# Patient Record
Sex: Female | Born: 1940
Health system: Southern US, Community
[De-identification: ages and names within clinical notes are randomized; demographics above are authoritative.]

## PROBLEM LIST (undated history)

## (undated) DIAGNOSIS — Z860100 Personal history of colon polyps, unspecified: Secondary | ICD-10-CM

## (undated) DIAGNOSIS — I10 Essential (primary) hypertension: Secondary | ICD-10-CM

## (undated) DIAGNOSIS — M1990 Unspecified osteoarthritis, unspecified site: Secondary | ICD-10-CM

## (undated) DIAGNOSIS — E785 Hyperlipidemia, unspecified: Secondary | ICD-10-CM

## (undated) DIAGNOSIS — D649 Anemia, unspecified: Secondary | ICD-10-CM

## (undated) DIAGNOSIS — Z8719 Personal history of other diseases of the digestive system: Secondary | ICD-10-CM

## (undated) DIAGNOSIS — Z923 Personal history of irradiation: Secondary | ICD-10-CM

## (undated) DIAGNOSIS — C50919 Malignant neoplasm of unspecified site of unspecified female breast: Secondary | ICD-10-CM

## (undated) DIAGNOSIS — T7840XA Allergy, unspecified, initial encounter: Secondary | ICD-10-CM

## (undated) HISTORY — DX: Malignant neoplasm of unspecified site of unspecified female breast: C50.919

## (undated) HISTORY — PX: TUBAL LIGATION: SHX77

## (undated) HISTORY — DX: Anemia, unspecified: D64.9

## (undated) HISTORY — DX: Unspecified osteoarthritis, unspecified site: M19.90

## (undated) HISTORY — DX: Essential (primary) hypertension: I10

---

## 1999-09-23 ENCOUNTER — Other Ambulatory Visit: Admission: RE | Admit: 1999-09-23 | Discharge: 1999-09-23 | Payer: Self-pay | Admitting: *Deleted

## 2001-09-13 ENCOUNTER — Other Ambulatory Visit: Admission: RE | Admit: 2001-09-13 | Discharge: 2001-09-13 | Payer: Self-pay | Admitting: *Deleted

## 2004-10-14 ENCOUNTER — Ambulatory Visit: Payer: Self-pay | Admitting: Internal Medicine

## 2004-10-28 ENCOUNTER — Ambulatory Visit: Payer: Self-pay | Admitting: Internal Medicine

## 2005-11-17 ENCOUNTER — Ambulatory Visit: Payer: Self-pay | Admitting: Internal Medicine

## 2006-11-30 ENCOUNTER — Ambulatory Visit: Payer: Self-pay | Admitting: Internal Medicine

## 2006-12-06 ENCOUNTER — Ambulatory Visit: Payer: Self-pay | Admitting: Internal Medicine

## 2007-01-03 ENCOUNTER — Ambulatory Visit: Payer: Self-pay | Admitting: Unknown Physician Specialty

## 2007-12-04 ENCOUNTER — Ambulatory Visit: Payer: Self-pay | Admitting: Internal Medicine

## 2007-12-31 ENCOUNTER — Ambulatory Visit: Payer: Self-pay | Admitting: Internal Medicine

## 2008-11-07 DIAGNOSIS — Z923 Personal history of irradiation: Secondary | ICD-10-CM

## 2008-11-07 DIAGNOSIS — C50919 Malignant neoplasm of unspecified site of unspecified female breast: Secondary | ICD-10-CM

## 2008-11-07 HISTORY — DX: Personal history of irradiation: Z92.3

## 2008-11-07 HISTORY — PX: BREAST LUMPECTOMY: SHX2

## 2008-11-07 HISTORY — PX: BREAST BIOPSY: SHX20

## 2008-11-07 HISTORY — DX: Malignant neoplasm of unspecified site of unspecified female breast: C50.919

## 2009-01-06 ENCOUNTER — Ambulatory Visit: Payer: Self-pay | Admitting: Internal Medicine

## 2009-01-08 ENCOUNTER — Ambulatory Visit: Payer: Self-pay | Admitting: Internal Medicine

## 2009-02-16 ENCOUNTER — Ambulatory Visit: Payer: Self-pay | Admitting: Surgery

## 2009-03-09 ENCOUNTER — Ambulatory Visit: Payer: Self-pay | Admitting: Surgery

## 2009-03-26 ENCOUNTER — Ambulatory Visit: Payer: Self-pay | Admitting: Surgery

## 2009-04-07 ENCOUNTER — Ambulatory Visit: Payer: Self-pay | Admitting: Oncology

## 2009-04-10 ENCOUNTER — Ambulatory Visit: Payer: Self-pay | Admitting: Oncology

## 2009-04-23 ENCOUNTER — Ambulatory Visit: Payer: Self-pay | Admitting: Surgery

## 2009-05-07 ENCOUNTER — Ambulatory Visit: Payer: Self-pay | Admitting: Oncology

## 2009-06-07 ENCOUNTER — Ambulatory Visit: Payer: Self-pay | Admitting: Oncology

## 2009-10-07 ENCOUNTER — Ambulatory Visit: Payer: Self-pay | Admitting: Oncology

## 2009-10-15 ENCOUNTER — Ambulatory Visit: Payer: Self-pay | Admitting: Oncology

## 2009-11-07 ENCOUNTER — Ambulatory Visit: Payer: Self-pay | Admitting: Oncology

## 2010-01-07 ENCOUNTER — Ambulatory Visit: Payer: Self-pay | Admitting: Oncology

## 2010-03-07 ENCOUNTER — Ambulatory Visit: Payer: Self-pay | Admitting: Oncology

## 2010-03-08 ENCOUNTER — Ambulatory Visit: Payer: Self-pay | Admitting: Oncology

## 2010-04-07 ENCOUNTER — Ambulatory Visit: Payer: Self-pay | Admitting: Oncology

## 2010-04-15 ENCOUNTER — Ambulatory Visit: Payer: Self-pay | Admitting: Oncology

## 2010-05-07 ENCOUNTER — Ambulatory Visit: Payer: Self-pay | Admitting: Oncology

## 2010-08-13 ENCOUNTER — Ambulatory Visit: Payer: Self-pay | Admitting: Oncology

## 2010-08-16 ENCOUNTER — Ambulatory Visit: Payer: Self-pay | Admitting: Oncology

## 2010-08-17 LAB — CANCER ANTIGEN 27.29: CA 27.29: 33.2 U/mL (ref 0.0–38.6)

## 2010-09-07 ENCOUNTER — Ambulatory Visit: Payer: Self-pay | Admitting: Oncology

## 2011-01-06 ENCOUNTER — Ambulatory Visit: Payer: Self-pay

## 2011-02-07 ENCOUNTER — Ambulatory Visit: Payer: Self-pay | Admitting: Oncology

## 2011-02-08 ENCOUNTER — Ambulatory Visit: Payer: Self-pay | Admitting: Oncology

## 2011-03-03 ENCOUNTER — Ambulatory Visit: Payer: Self-pay | Admitting: Oncology

## 2011-03-05 LAB — CANCER ANTIGEN 27.29: CA 27.29: 33 U/mL (ref 0.0–38.6)

## 2011-03-08 ENCOUNTER — Ambulatory Visit: Payer: Self-pay | Admitting: Oncology

## 2011-04-08 ENCOUNTER — Ambulatory Visit: Payer: Self-pay

## 2011-08-10 ENCOUNTER — Ambulatory Visit: Payer: Self-pay | Admitting: Nurse Practitioner

## 2011-08-22 ENCOUNTER — Ambulatory Visit: Payer: Self-pay | Admitting: Oncology

## 2011-08-23 LAB — CANCER ANTIGEN 27.29: CA 27.29: 31.7 U/mL (ref 0.0–38.6)

## 2011-09-08 ENCOUNTER — Ambulatory Visit: Payer: Self-pay | Admitting: Oncology

## 2012-04-24 ENCOUNTER — Ambulatory Visit: Payer: Self-pay | Admitting: Unknown Physician Specialty

## 2012-04-24 HISTORY — PX: COLONOSCOPY: SHX174

## 2012-05-28 ENCOUNTER — Ambulatory Visit: Payer: Self-pay | Admitting: Oncology

## 2012-06-05 LAB — COMPREHENSIVE METABOLIC PANEL
Albumin: 4.2 g/dL (ref 3.4–5.0)
Alkaline Phosphatase: 107 U/L (ref 50–136)
Anion Gap: 11 (ref 7–16)
BUN: 21 mg/dL — ABNORMAL HIGH (ref 7–18)
Bilirubin,Total: 0.6 mg/dL (ref 0.2–1.0)
Calcium, Total: 10.1 mg/dL (ref 8.5–10.1)
Chloride: 102 mmol/L (ref 98–107)
Co2: 25 mmol/L (ref 21–32)
Creatinine: 0.94 mg/dL (ref 0.60–1.30)
EGFR (African American): >60
EGFR (Non-African Amer.): >60
Glucose: 104 mg/dL — ABNORMAL HIGH (ref 65–99)
Osmolality: 279 (ref 275–301)
Potassium: 3.9 mmol/L (ref 3.5–5.1)
SGOT(AST): 22 U/L (ref 15–37)
SGPT (ALT): 34 U/L
Sodium: 138 mmol/L (ref 136–145)
Total Protein: 7.5 g/dL (ref 6.4–8.2)

## 2012-06-05 LAB — CBC CANCER CENTER
Basophil #: 0 x10 3/mm (ref 0.0–0.1)
Basophil %: 0.4 %
Eosinophil #: 0.2 x10 3/mm (ref 0.0–0.7)
Eosinophil %: 2.1 %
HCT: 36.9 % (ref 35.0–47.0)
HGB: 12.9 g/dL (ref 12.0–16.0)
Lymphocyte #: 2.2 x10 3/mm (ref 1.0–3.6)
Lymphocyte %: 29.5 %
MCH: 31.1 pg (ref 26.0–34.0)
MCHC: 35 g/dL (ref 32.0–36.0)
MCV: 89 fL (ref 80–100)
Monocyte #: 0.6 x10 3/mm (ref 0.2–0.9)
Monocyte %: 7.6 %
Neutrophil #: 4.4 x10 3/mm (ref 1.4–6.5)
Neutrophil %: 60.4 %
Platelet: 272 x10 3/mm (ref 150–440)
RBC: 4.15 10*6/uL (ref 3.80–5.20)
RDW: 13.6 % (ref 11.5–14.5)
WBC: 7.3 x10 3/mm (ref 3.6–11.0)

## 2012-06-06 LAB — CANCER ANTIGEN 27.29: CA 27.29: 37.6 U/mL (ref 0.0–38.6)

## 2012-06-07 ENCOUNTER — Ambulatory Visit: Payer: Self-pay | Admitting: Oncology

## 2012-07-08 ENCOUNTER — Ambulatory Visit: Payer: Self-pay | Admitting: Oncology

## 2012-11-07 ENCOUNTER — Ambulatory Visit: Payer: Self-pay | Admitting: Oncology

## 2012-12-04 LAB — CBC CANCER CENTER
Basophil #: 0 x10 3/mm (ref 0.0–0.1)
Basophil %: 0.6 %
Eosinophil #: 0.2 x10 3/mm (ref 0.0–0.7)
Eosinophil %: 3 %
HCT: 37.2 % (ref 35.0–47.0)
HGB: 12.6 g/dL (ref 12.0–16.0)
Lymphocyte #: 1.8 x10 3/mm (ref 1.0–3.6)
Lymphocyte %: 26.1 %
MCH: 30 pg (ref 26.0–34.0)
MCHC: 33.8 g/dL (ref 32.0–36.0)
MCV: 89 fL (ref 80–100)
Monocyte #: 0.5 x10 3/mm (ref 0.2–0.9)
Monocyte %: 7.8 %
Neutrophil #: 4.3 x10 3/mm (ref 1.4–6.5)
Neutrophil %: 62.5 %
Platelet: 252 x10 3/mm (ref 150–440)
RBC: 4.19 10*6/uL (ref 3.80–5.20)
RDW: 13.5 % (ref 11.5–14.5)
WBC: 6.9 x10 3/mm (ref 3.6–11.0)

## 2012-12-04 LAB — COMPREHENSIVE METABOLIC PANEL
Albumin: 4 g/dL (ref 3.4–5.0)
Alkaline Phosphatase: 102 U/L (ref 50–136)
Anion Gap: 9 (ref 7–16)
BUN: 18 mg/dL (ref 7–18)
Bilirubin,Total: 0.5 mg/dL (ref 0.2–1.0)
Calcium, Total: 9.9 mg/dL (ref 8.5–10.1)
Chloride: 104 mmol/L (ref 98–107)
Co2: 27 mmol/L (ref 21–32)
Creatinine: 0.86 mg/dL (ref 0.60–1.30)
EGFR (African American): >60
EGFR (Non-African Amer.): >60
Glucose: 123 mg/dL — ABNORMAL HIGH (ref 65–99)
Osmolality: 283 (ref 275–301)
Potassium: 4.1 mmol/L (ref 3.5–5.1)
SGOT(AST): 21 U/L (ref 15–37)
SGPT (ALT): 34 U/L (ref 12–78)
Sodium: 140 mmol/L (ref 136–145)
Total Protein: 7.3 g/dL (ref 6.4–8.2)

## 2012-12-05 LAB — CANCER ANTIGEN 27.29: CA 27.29: 35.8 U/mL (ref 0.0–38.6)

## 2012-12-08 ENCOUNTER — Ambulatory Visit: Payer: Self-pay | Admitting: Oncology

## 2013-05-07 ENCOUNTER — Ambulatory Visit: Payer: Self-pay | Admitting: Oncology

## 2013-06-04 LAB — CBC CANCER CENTER
Basophil #: 0 x10 3/mm (ref 0.0–0.1)
Basophil %: 0.7 %
Eosinophil #: 0.2 x10 3/mm (ref 0.0–0.7)
Eosinophil %: 3.4 %
HCT: 35.7 % (ref 35.0–47.0)
HGB: 12.6 g/dL (ref 12.0–16.0)
Lymphocyte #: 2.1 x10 3/mm (ref 1.0–3.6)
Lymphocyte %: 31.6 %
MCH: 31 pg (ref 26.0–34.0)
MCHC: 35.3 g/dL (ref 32.0–36.0)
MCV: 88 fL (ref 80–100)
Monocyte #: 0.5 x10 3/mm (ref 0.2–0.9)
Monocyte %: 6.9 %
Neutrophil #: 3.9 x10 3/mm (ref 1.4–6.5)
Neutrophil %: 57.4 %
Platelet: 263 x10 3/mm (ref 150–440)
RBC: 4.08 10*6/uL (ref 3.80–5.20)
RDW: 14 % (ref 11.5–14.5)
WBC: 6.7 x10 3/mm (ref 3.6–11.0)

## 2013-06-04 LAB — COMPREHENSIVE METABOLIC PANEL
Albumin: 3.8 g/dL (ref 3.4–5.0)
Alkaline Phosphatase: 119 U/L (ref 50–136)
Anion Gap: 8 (ref 7–16)
BUN: 17 mg/dL (ref 7–18)
Bilirubin,Total: 0.5 mg/dL (ref 0.2–1.0)
Calcium, Total: 9.6 mg/dL (ref 8.5–10.1)
Chloride: 105 mmol/L (ref 98–107)
Co2: 29 mmol/L (ref 21–32)
Creatinine: 0.9 mg/dL (ref 0.60–1.30)
EGFR (African American): >60
EGFR (Non-African Amer.): >60
Glucose: 109 mg/dL — ABNORMAL HIGH (ref 65–99)
Osmolality: 285 (ref 275–301)
Potassium: 3.9 mmol/L (ref 3.5–5.1)
SGOT(AST): 19 U/L (ref 15–37)
SGPT (ALT): 30 U/L (ref 12–78)
Sodium: 142 mmol/L (ref 136–145)
Total Protein: 7.1 g/dL (ref 6.4–8.2)

## 2013-06-05 LAB — CANCER ANTIGEN 27.29: CA 27.29: 34.5 U/mL (ref 0.0–38.6)

## 2013-06-07 ENCOUNTER — Ambulatory Visit: Payer: Self-pay | Admitting: Oncology

## 2013-07-08 ENCOUNTER — Ambulatory Visit: Payer: Self-pay | Admitting: Oncology

## 2013-12-05 ENCOUNTER — Ambulatory Visit: Payer: Self-pay | Admitting: Oncology

## 2013-12-05 LAB — CBC CANCER CENTER
Basophil #: 0 x10 3/mm (ref 0.0–0.1)
Basophil %: 0.7 %
Eosinophil #: 0.2 x10 3/mm (ref 0.0–0.7)
Eosinophil %: 2.6 %
HCT: 38.6 % (ref 35.0–47.0)
HGB: 12.8 g/dL (ref 12.0–16.0)
Lymphocyte #: 2.1 x10 3/mm (ref 1.0–3.6)
Lymphocyte %: 27.6 %
MCH: 29.8 pg (ref 26.0–34.0)
MCHC: 33.2 g/dL (ref 32.0–36.0)
MCV: 90 fL (ref 80–100)
Monocyte #: 0.6 x10 3/mm (ref 0.2–0.9)
Monocyte %: 8 %
Neutrophil #: 4.5 x10 3/mm (ref 1.4–6.5)
Neutrophil %: 61.1 %
Platelet: 286 x10 3/mm (ref 150–440)
RBC: 4.32 10*6/uL (ref 3.80–5.20)
RDW: 13.7 % (ref 11.5–14.5)
WBC: 7.4 x10 3/mm (ref 3.6–11.0)

## 2013-12-05 LAB — COMPREHENSIVE METABOLIC PANEL
Albumin: 3.9 g/dL (ref 3.4–5.0)
Alkaline Phosphatase: 106 U/L
Anion Gap: 9 (ref 7–16)
BUN: 17 mg/dL (ref 7–18)
Bilirubin,Total: 0.5 mg/dL (ref 0.2–1.0)
Calcium, Total: 9.9 mg/dL (ref 8.5–10.1)
Chloride: 104 mmol/L (ref 98–107)
Co2: 27 mmol/L (ref 21–32)
Creatinine: 0.84 mg/dL (ref 0.60–1.30)
EGFR (African American): >60
EGFR (Non-African Amer.): >60
Glucose: 104 mg/dL — ABNORMAL HIGH (ref 65–99)
Osmolality: 281 (ref 275–301)
Potassium: 4 mmol/L (ref 3.5–5.1)
SGOT(AST): 22 U/L (ref 15–37)
SGPT (ALT): 34 U/L (ref 12–78)
Sodium: 140 mmol/L (ref 136–145)
Total Protein: 7.3 g/dL (ref 6.4–8.2)

## 2013-12-06 LAB — CANCER ANTIGEN 27.29: CA 27.29: 35.7 U/mL (ref 0.0–38.6)

## 2013-12-08 ENCOUNTER — Ambulatory Visit: Payer: Self-pay | Admitting: Oncology

## 2014-06-26 ENCOUNTER — Ambulatory Visit: Payer: Self-pay | Admitting: Oncology

## 2014-06-26 LAB — COMPREHENSIVE METABOLIC PANEL
Albumin: 3.8 g/dL (ref 3.4–5.0)
Alkaline Phosphatase: 99 U/L
Anion Gap: 9 (ref 7–16)
BUN: 14 mg/dL (ref 7–18)
Bilirubin,Total: 0.6 mg/dL (ref 0.2–1.0)
Calcium, Total: 9.8 mg/dL (ref 8.5–10.1)
Chloride: 104 mmol/L (ref 98–107)
Co2: 28 mmol/L (ref 21–32)
Creatinine: 0.94 mg/dL (ref 0.60–1.30)
EGFR (African American): >60
EGFR (Non-African Amer.): >60
Glucose: 101 mg/dL — ABNORMAL HIGH (ref 65–99)
Osmolality: 282 (ref 275–301)
Potassium: 4.1 mmol/L (ref 3.5–5.1)
SGOT(AST): 22 U/L (ref 15–37)
SGPT (ALT): 32 U/L
Sodium: 141 mmol/L (ref 136–145)
Total Protein: 7.2 g/dL (ref 6.4–8.2)

## 2014-06-26 LAB — CBC CANCER CENTER
Basophil #: 0 x10 3/mm (ref 0.0–0.1)
Basophil %: 0.7 %
Eosinophil #: 0.2 x10 3/mm (ref 0.0–0.7)
Eosinophil %: 3 %
HCT: 38 % (ref 35.0–47.0)
HGB: 12.9 g/dL (ref 12.0–16.0)
Lymphocyte #: 2 x10 3/mm (ref 1.0–3.6)
Lymphocyte %: 30.7 %
MCH: 30.3 pg (ref 26.0–34.0)
MCHC: 33.9 g/dL (ref 32.0–36.0)
MCV: 89 fL (ref 80–100)
Monocyte #: 0.5 x10 3/mm (ref 0.2–0.9)
Monocyte %: 7.6 %
Neutrophil #: 3.7 x10 3/mm (ref 1.4–6.5)
Neutrophil %: 58 %
Platelet: 281 x10 3/mm (ref 150–440)
RBC: 4.24 10*6/uL (ref 3.80–5.20)
RDW: 13.4 % (ref 11.5–14.5)
WBC: 6.4 x10 3/mm (ref 3.6–11.0)

## 2014-06-27 ENCOUNTER — Ambulatory Visit: Payer: Self-pay | Admitting: Oncology

## 2014-06-27 LAB — CANCER ANTIGEN 27.29: CA 27.29: 41.2 U/mL — ABNORMAL HIGH (ref 0.0–38.6)

## 2014-07-08 ENCOUNTER — Ambulatory Visit: Payer: Self-pay | Admitting: Oncology

## 2015-07-02 ENCOUNTER — Ambulatory Visit: Payer: Self-pay | Admitting: Oncology

## 2015-07-02 ENCOUNTER — Other Ambulatory Visit: Payer: Self-pay

## 2015-07-21 ENCOUNTER — Ambulatory Visit: Payer: Self-pay | Admitting: Oncology

## 2015-07-21 ENCOUNTER — Other Ambulatory Visit: Payer: Self-pay

## 2015-07-24 ENCOUNTER — Other Ambulatory Visit: Payer: Self-pay | Admitting: *Deleted

## 2015-07-24 DIAGNOSIS — C50919 Malignant neoplasm of unspecified site of unspecified female breast: Secondary | ICD-10-CM

## 2015-07-28 ENCOUNTER — Other Ambulatory Visit: Payer: Self-pay

## 2015-07-28 ENCOUNTER — Ambulatory Visit: Payer: Self-pay | Admitting: Oncology

## 2015-07-29 ENCOUNTER — Ambulatory Visit: Payer: PPO

## 2015-07-29 ENCOUNTER — Inpatient Hospital Stay: Payer: PPO | Attending: Oncology | Admitting: Oncology

## 2015-07-29 ENCOUNTER — Inpatient Hospital Stay: Payer: PPO

## 2015-07-29 ENCOUNTER — Encounter: Payer: Self-pay | Admitting: Oncology

## 2015-07-29 VITALS — BP 149/81 | HR 83 | Temp 97.4°F | Wt 187.0 lb

## 2015-07-29 DIAGNOSIS — Z853 Personal history of malignant neoplasm of breast: Secondary | ICD-10-CM | POA: Diagnosis not present

## 2015-07-29 DIAGNOSIS — Z7982 Long term (current) use of aspirin: Secondary | ICD-10-CM | POA: Insufficient documentation

## 2015-07-29 DIAGNOSIS — C50919 Malignant neoplasm of unspecified site of unspecified female breast: Secondary | ICD-10-CM

## 2015-07-29 DIAGNOSIS — Z79899 Other long term (current) drug therapy: Secondary | ICD-10-CM | POA: Insufficient documentation

## 2015-07-29 DIAGNOSIS — Z23 Encounter for immunization: Secondary | ICD-10-CM | POA: Insufficient documentation

## 2015-07-29 DIAGNOSIS — Z87891 Personal history of nicotine dependence: Secondary | ICD-10-CM | POA: Insufficient documentation

## 2015-07-29 DIAGNOSIS — Z170 Estrogen receptor positive status [ER+]: Secondary | ICD-10-CM | POA: Insufficient documentation

## 2015-07-29 DIAGNOSIS — I10 Essential (primary) hypertension: Secondary | ICD-10-CM | POA: Diagnosis not present

## 2015-07-29 LAB — CBC WITH DIFFERENTIAL/PLATELET
Basophils Absolute: 0 10*3/uL (ref 0–0.1)
Basophils Relative: 1 %
Eosinophils Absolute: 0.2 10*3/uL (ref 0–0.7)
Eosinophils Relative: 3 %
HCT: 38.3 % (ref 35.0–47.0)
Hemoglobin: 13.1 g/dL (ref 12.0–16.0)
Lymphocytes Relative: 33 %
Lymphs Abs: 2.3 10*3/uL (ref 1.0–3.6)
MCH: 30.1 pg (ref 26.0–34.0)
MCHC: 34.2 g/dL (ref 32.0–36.0)
MCV: 88 fL (ref 80.0–100.0)
Monocytes Absolute: 0.6 10*3/uL (ref 0.2–0.9)
Monocytes Relative: 9 %
Neutro Abs: 4 10*3/uL (ref 1.4–6.5)
Neutrophils Relative %: 56 %
Platelets: 278 10*3/uL (ref 150–440)
RBC: 4.35 MIL/uL (ref 3.80–5.20)
RDW: 13.8 % (ref 11.5–14.5)
WBC: 7.2 10*3/uL (ref 3.6–11.0)

## 2015-07-29 LAB — COMPREHENSIVE METABOLIC PANEL
ALT: 23 U/L (ref 14–54)
AST: 23 U/L (ref 15–41)
Albumin: 4.4 g/dL (ref 3.5–5.0)
Alkaline Phosphatase: 94 U/L (ref 38–126)
Anion gap: 5 (ref 5–15)
BUN: 16 mg/dL (ref 6–20)
CO2: 28 mmol/L (ref 22–32)
Calcium: 9.7 mg/dL (ref 8.9–10.3)
Chloride: 104 mmol/L (ref 101–111)
Creatinine, Ser: 0.72 mg/dL (ref 0.44–1.00)
GFR calc Af Amer: >60 mL/min (ref >60)
GFR calc non Af Amer: >60 mL/min (ref >60)
Glucose, Bld: 100 mg/dL — ABNORMAL HIGH (ref 65–99)
Potassium: 3.8 mmol/L (ref 3.5–5.1)
Sodium: 137 mmol/L (ref 135–145)
Total Bilirubin: 0.7 mg/dL (ref 0.3–1.2)
Total Protein: 7.3 g/dL (ref 6.5–8.1)

## 2015-07-29 MED ORDER — INFLUENZA VAC SPLIT QUAD 0.5 ML IM SUSY
0.5000 mL | PREFILLED_SYRINGE | Freq: Once | INTRAMUSCULAR | Status: AC
  Administered 2015-07-29: 0.5 mL via INTRAMUSCULAR

## 2015-07-29 NOTE — Progress Notes (Signed)
[place] @ [facility] [phone]) [phone]  Fax:([phone]     SHIRL WEIR OB: [birth year]  MR#: [id number]  LXB#:[id number]  Patient Care Team: Idelle Crouch, MD as PCP - General (Internal Medicine)  CHIEF COMPLAINT:  1. Carcinoma of breast, status post lumpectomy and sentinel lymph node biopsy on Mar 09 2009. MammoSite T. for local radiation treatment T1 C., N0, M0 tumor.  Estrogen receptor positive, progesterone receptor positive HER-2 receptor negative Oncotype DX score was low (10) started on Femara on April 30, 2009 3/patient was taken off  [place] in August of 2015 as patient has finished her 5 years of anti-hormonal therapy     INTERVAL HISTORY:  74 year old lady came today further follow-up regarding carcinoma of breast stage IC disease. Patient is due for mammogram last mammogram was in August of 2015. Did not have any bony pain.  No bony fracture. Patient is doing self breast examination Has finished 5 years of letrozole last year  REVIEW OF SYSTEMS:   GENERAL:  Feels good.  Active.  No fevers, sweats or weight loss. PERFORMANCE STATUS (ECOG)01 HEENT:  No visual changes, runny nose, sore throat, mouth sores or tenderness. Lungs: No shortness of breath or cough.  No hemoptysis. Cardiac:  No chest pain, palpitations, orthopnea, or PND. GI:  No nausea, vomiting, diarrhea, constipation, melena or hematochezia. GU:  No urgency, frequency, dysuria, or hematuria. Musculoskeletal:  No back pain.  No joint pain.  No muscle tenderness. Extremities:  No pain or swelling. Skin:  No rashes or skin changes. Neuro:  No headache, numbness or weakness, balance or coordination issues. Endocrine:  No diabetes, thyroid issues, hot flashes or night sweats. Psych:  No mood changes, depression or anxiety. Pain:  No focal pain. Review of systems:  All other systems reviewed and found to be negative. As per HPI. Otherwise, a complete review of systems is  negatve.  Significant History/PMH:   Arthritis:    HTN:    Breast Ca:   Preventive Screening:  Has patient had any of the following test? Mammography (1)   Last Mammography: October 2014(1)   Smoking History: Smoking History Never Smoked.(1)  PFSH: Family History: noncontributory, No family history of colorectal cancer, breast cancer, or ovarian cancer.  Social History: noncontributory  Comments: retired Radio producer.  Does not smoke occasionally drinks  Additional Past Medical and Surgical History: hypertension and hypercholesterolemia  tubal ligation   ADVANCED DIRECTIVES:  Patient does have advance healthcare directive, Patient   does not desire to make any changes  HEALTH MAINTENANCE: Social History  Substance Use Topics  . Smoking status: Former Research scientist (life sciences)  . Smokeless tobacco: Not on file  . Alcohol Use: Not on file      No Known Allergies  Current Outpatient Prescriptions  Medication Sig Dispense Refill  . atorvastatin (LIPITOR) 20 MG tablet Take 1 tablet by mouth once daily    . losartan-hydrochlorothiazide (HYZAAR) 100-12.5 MG per tablet Take 1 tablet by mouth once daily    . aspirin EC 81 MG tablet Take by mouth.    . calcium carbonate (OS-CAL - DOSED IN MG OF ELEMENTAL CALCIUM) 1250 (500 CA) MG tablet Take by mouth.    . Multiple Vitamin (MULTIVITAMIN) capsule Take by mouth.     No current facility-administered medications for this visit.    OBJECTIVE:  Filed Vitals:   07/29/15 1552  BP: 149/81  Pulse: 83  Temp: 97.4 F (36.3 C)     There is no height on file to  calculate BMI.    ECOG FS:1 - Symptomatic but completely ambulatory  PHYSICAL EXAM: General  status: Performance status is good.  Patient has not lost significant weight HEENT: No evidence of stomatitis. Sclera and conjunctivae :: No jaundice.   pale looking. Lungs: Air  entry equal on both sides.  No rhonchi.  No rales.  Cardiac: Heart sounds are normal.  No pericardial rub.  No  murmur. Lymphatic system: Cervical, axillary, inguinal, lymph nodes not palpable GI: Abdomen is soft.  No ascites.  Liver spleen not palpable.  No tenderness.  Bowel sounds are within normal limit Lower extremity: No edema Neurological system: Higher functions, cranial nerves intact no evidence of peripheral neuropathy. Skin: No rash.  No ecchymosis.. Examination of both breast (right breast free of masses. Left breast continues to have small palpable indurated area at the previous site of MammoSite therapy most likely representing scar tissue Axillary lymph nodes are not palpable.   LAB RESULTS:  CBC Latest Ref Rng 06/26/2014 12/05/2013  WBC 3.6-11.0 x10 3/mm  6.4 7.4  Hemoglobin 12.0-16.0 g/dL 12.9 12.8  Hematocrit 35.0-47.0 % 38.0 38.6  Platelets 150-440 x10 3/mm  281 286    No visits with results within 5 Day(s) from this visit. Latest known visit with results is:  [facility] Conversion on 06/26/2014  Component Date Value Ref Range Status  . CA 27.29 06/26/2014 41.2* 0.0-38.6 U/mL Final   Comment:  Research scientist (life sciences)            [facility]            No: [id number]           [address] [place], [place] [address]           Lindon Romp, MD         [phone] Result(s) reported on 27 Jun 2014 at 11:49AM.   . WBC 06/26/2014 6.4  3.6-11.0 x10 3/mm  Final  . RBC 06/26/2014 4.24  3.80-5.20 x10 6/mm  Final  . HGB 06/26/2014 12.9  12.0-16.0 g/dL Final  . HCT 06/26/2014 38.0  35.0-47.0 % Final  . MCV 06/26/2014 89  80-100 fL Final  . MCH 06/26/2014 30.3  26.0-34.0 pg Final  . MCHC 06/26/2014 33.9  32.0-36.0 g/dL Final  . RDW 06/26/2014 13.4  11.5-14.5 % Final  . Platelet 06/26/2014 281  150-440 x10 3/mm  Final  . Neutrophil % 06/26/2014 58.0   Final  . Lymphocyte % 06/26/2014 30.7   Final  . Monocyte % 06/26/2014 7.6   Final  . Eosinophil % 06/26/2014 3.0   Final  . Basophil % 06/26/2014 0.7   Final  . Neutrophil # 06/26/2014 3.7  1.4-6.5 x10 3/mm   Final  . Lymphocyte # 06/26/2014 2.0  1.0-3.6 x10 3/mm  Final  . Monocyte # 06/26/2014 0.5  0.2-0.9 x10 3/mm  Final  . Eosinophil # 06/26/2014 0.2  0.0-0.7 x10 3/mm  Final  . Basophil # 06/26/2014 0.0  0.0-0.1 x10 3/mm  Final  . Glucose 06/26/2014 101* 65-99 mg/dL Final  . BUN 06/26/2014 14  7-18 mg/dL Final  . Creatinine 06/26/2014 0.94  0.60-1.30 mg/dL Final  . Sodium 06/26/2014 141  136-145 mmol/L Final  . Potassium 06/26/2014 4.1  3.5-5.1 mmol/L Final  . Chloride 06/26/2014 104  98-107 mmol/L Final  . Co2 06/26/2014 28  21-32 mmol/L Final  . Calcium, Total 06/26/2014 9.8  8.5-10.1 mg/dL Final  . SGOT(AST) 06/26/2014 22  15-37 Unit/L Final  . SGPT (ALT) 06/26/2014  32   Final   Comment: 14-63 NOTE: New Reference Range 05/27/14   . Alkaline Phosphatase 06/26/2014 99   Final   Comment: 46-116 NOTE: New Reference Range 05/27/14   . Albumin 06/26/2014 3.8  3.4-5.0 g/dL Final  . Total Protein 06/26/2014 7.2  6.4-8.2 g/dL Final  . Bilirubin,Total 06/26/2014 0.6  0.2-1.0 mg/dL Final  . Osmolality 06/26/2014 282  275-301 Final  . Anion Gap 06/26/2014 9  7-16 Final  . EGFR (African American) 06/26/2014 >60   Final  . EGFR (Non-African Amer.) 06/26/2014 >60   Final   Comment: eGFR values <29m/min/1.73 m2 may be an indication of chronic kidney disease (CKD). Calculated eGFR is useful in patients with stable renal function. The eGFR calculation will not be reliable in acutely ill patients when serum creatinine is changing rapidly. It is not useful in  patients on dialysis. The eGFR calculation may not be applicable to patients at the low and high extremes of body sizes, pregnant women, and vegetarians.         ASSESSMENT: Carcinoma of breast (left) stage I disease.  Status post lumpectomy and MammoSite therapy and 5 years of letrozole therapy On clinical examination there is no evidence of recurrent disease CA  27.29 slightly elevated.  Will be followed.  If it continues to go  up a PET scan would be recommended.  If it remains stable further evaluation would be planned in one year. 2.  Mammogram of both breasts has been ordered and will be followed. 3.  All lab data has been reviewed and.  4. Patient desires flu shot which will be given today by my nurse    Patient expressed understanding and was in agreement with this plan. She also understands that She can call clinic at any time with any questions, concerns, or complaints.    No matching staging information was found for the patient.  JForest Gleason MD   07/29/2015 4:07 PM

## 2015-07-29 NOTE — Progress Notes (Signed)
Patient doe have living will.  Never smoked.

## 2015-07-30 ENCOUNTER — Ambulatory Visit: Payer: PPO

## 2015-08-12 ENCOUNTER — Other Ambulatory Visit: Payer: PPO

## 2015-08-12 ENCOUNTER — Ambulatory Visit: Payer: PPO

## 2015-08-25 ENCOUNTER — Ambulatory Visit: Payer: PPO

## 2015-08-25 ENCOUNTER — Other Ambulatory Visit: Payer: PPO

## 2015-08-27 ENCOUNTER — Ambulatory Visit
Admission: RE | Admit: 2015-08-27 | Discharge: 2015-08-27 | Disposition: A | Discharge status: Home / Self Care | Payer: PPO | Source: Ambulatory Visit | Attending: Oncology | Admitting: Oncology

## 2015-08-27 ENCOUNTER — Other Ambulatory Visit: Payer: Self-pay | Admitting: Oncology

## 2015-08-27 DIAGNOSIS — C50919 Malignant neoplasm of unspecified site of unspecified female breast: Secondary | ICD-10-CM

## 2015-08-27 DIAGNOSIS — Z9889 Other specified postprocedural states: Secondary | ICD-10-CM | POA: Diagnosis not present

## 2015-08-27 DIAGNOSIS — Z853 Personal history of malignant neoplasm of breast: Secondary | ICD-10-CM | POA: Diagnosis not present

## 2015-11-18 DIAGNOSIS — E538 Deficiency of other specified B group vitamins: Secondary | ICD-10-CM | POA: Diagnosis not present

## 2015-11-18 DIAGNOSIS — D649 Anemia, unspecified: Secondary | ICD-10-CM | POA: Diagnosis not present

## 2015-11-18 DIAGNOSIS — Z1329 Encounter for screening for other suspected endocrine disorder: Secondary | ICD-10-CM | POA: Diagnosis not present

## 2015-11-18 DIAGNOSIS — E7800 Pure hypercholesterolemia, unspecified: Secondary | ICD-10-CM | POA: Diagnosis not present

## 2015-11-18 DIAGNOSIS — I10 Essential (primary) hypertension: Secondary | ICD-10-CM | POA: Diagnosis not present

## 2015-11-18 DIAGNOSIS — C50919 Malignant neoplasm of unspecified site of unspecified female breast: Secondary | ICD-10-CM | POA: Diagnosis not present

## 2015-11-18 DIAGNOSIS — Z79899 Other long term (current) drug therapy: Secondary | ICD-10-CM | POA: Diagnosis not present

## 2015-11-24 DIAGNOSIS — I10 Essential (primary) hypertension: Secondary | ICD-10-CM | POA: Diagnosis not present

## 2015-11-24 DIAGNOSIS — Z79899 Other long term (current) drug therapy: Secondary | ICD-10-CM | POA: Diagnosis not present

## 2015-11-24 DIAGNOSIS — E538 Deficiency of other specified B group vitamins: Secondary | ICD-10-CM | POA: Diagnosis not present

## 2015-11-24 DIAGNOSIS — E7800 Pure hypercholesterolemia, unspecified: Secondary | ICD-10-CM | POA: Diagnosis not present

## 2015-11-24 DIAGNOSIS — Z0000 Encounter for general adult medical examination without abnormal findings: Secondary | ICD-10-CM | POA: Diagnosis not present

## 2015-11-24 DIAGNOSIS — C50919 Malignant neoplasm of unspecified site of unspecified female breast: Secondary | ICD-10-CM | POA: Diagnosis not present

## 2015-11-24 DIAGNOSIS — E559 Vitamin D deficiency, unspecified: Secondary | ICD-10-CM | POA: Diagnosis not present

## 2016-02-15 DIAGNOSIS — R002 Palpitations: Secondary | ICD-10-CM | POA: Diagnosis not present

## 2016-02-15 DIAGNOSIS — I10 Essential (primary) hypertension: Secondary | ICD-10-CM | POA: Diagnosis not present

## 2016-03-02 DIAGNOSIS — H353132 Nonexudative age-related macular degeneration, bilateral, intermediate dry stage: Secondary | ICD-10-CM | POA: Diagnosis not present

## 2016-03-08 DIAGNOSIS — R002 Palpitations: Secondary | ICD-10-CM | POA: Diagnosis not present

## 2016-05-24 DIAGNOSIS — E538 Deficiency of other specified B group vitamins: Secondary | ICD-10-CM | POA: Diagnosis not present

## 2016-05-24 DIAGNOSIS — E7800 Pure hypercholesterolemia, unspecified: Secondary | ICD-10-CM | POA: Diagnosis not present

## 2016-05-24 DIAGNOSIS — I10 Essential (primary) hypertension: Secondary | ICD-10-CM | POA: Diagnosis not present

## 2016-05-24 DIAGNOSIS — Z79899 Other long term (current) drug therapy: Secondary | ICD-10-CM | POA: Diagnosis not present

## 2016-05-24 DIAGNOSIS — E559 Vitamin D deficiency, unspecified: Secondary | ICD-10-CM | POA: Diagnosis not present

## 2016-05-31 DIAGNOSIS — I10 Essential (primary) hypertension: Secondary | ICD-10-CM | POA: Diagnosis not present

## 2016-05-31 DIAGNOSIS — E559 Vitamin D deficiency, unspecified: Secondary | ICD-10-CM | POA: Diagnosis not present

## 2016-05-31 DIAGNOSIS — Z79899 Other long term (current) drug therapy: Secondary | ICD-10-CM | POA: Diagnosis not present

## 2016-05-31 DIAGNOSIS — E7800 Pure hypercholesterolemia, unspecified: Secondary | ICD-10-CM | POA: Diagnosis not present

## 2016-05-31 DIAGNOSIS — E538 Deficiency of other specified B group vitamins: Secondary | ICD-10-CM | POA: Diagnosis not present

## 2016-07-26 ENCOUNTER — Other Ambulatory Visit: Payer: Self-pay | Admitting: *Deleted

## 2016-07-26 DIAGNOSIS — I10 Essential (primary) hypertension: Secondary | ICD-10-CM | POA: Diagnosis not present

## 2016-07-26 DIAGNOSIS — Z853 Personal history of malignant neoplasm of breast: Secondary | ICD-10-CM

## 2016-07-28 ENCOUNTER — Inpatient Hospital Stay: Payer: PPO

## 2016-07-28 ENCOUNTER — Inpatient Hospital Stay: Payer: PPO | Admitting: Hematology and Oncology

## 2016-07-28 ENCOUNTER — Ambulatory Visit: Payer: PPO | Admitting: Oncology

## 2016-08-02 DIAGNOSIS — D2272 Melanocytic nevi of left lower limb, including hip: Secondary | ICD-10-CM | POA: Diagnosis not present

## 2016-08-02 DIAGNOSIS — D225 Melanocytic nevi of trunk: Secondary | ICD-10-CM | POA: Diagnosis not present

## 2016-08-02 DIAGNOSIS — D2271 Melanocytic nevi of right lower limb, including hip: Secondary | ICD-10-CM | POA: Diagnosis not present

## 2016-08-02 DIAGNOSIS — L821 Other seborrheic keratosis: Secondary | ICD-10-CM | POA: Diagnosis not present

## 2016-08-15 ENCOUNTER — Other Ambulatory Visit: Payer: Self-pay

## 2016-08-15 DIAGNOSIS — C801 Malignant (primary) neoplasm, unspecified: Secondary | ICD-10-CM

## 2016-08-22 ENCOUNTER — Inpatient Hospital Stay: Payer: PPO | Attending: Hematology and Oncology | Admitting: Internal Medicine

## 2016-08-22 ENCOUNTER — Inpatient Hospital Stay: Payer: PPO

## 2016-08-22 ENCOUNTER — Encounter: Payer: Self-pay | Admitting: Internal Medicine

## 2016-08-22 DIAGNOSIS — Z7982 Long term (current) use of aspirin: Secondary | ICD-10-CM | POA: Insufficient documentation

## 2016-08-22 DIAGNOSIS — Z923 Personal history of irradiation: Secondary | ICD-10-CM | POA: Insufficient documentation

## 2016-08-22 DIAGNOSIS — M1990 Unspecified osteoarthritis, unspecified site: Secondary | ICD-10-CM | POA: Insufficient documentation

## 2016-08-22 DIAGNOSIS — Z79899 Other long term (current) drug therapy: Secondary | ICD-10-CM | POA: Diagnosis not present

## 2016-08-22 DIAGNOSIS — Z87891 Personal history of nicotine dependence: Secondary | ICD-10-CM | POA: Diagnosis not present

## 2016-08-22 DIAGNOSIS — Z170 Estrogen receptor positive status [ER+]: Secondary | ICD-10-CM | POA: Diagnosis not present

## 2016-08-22 DIAGNOSIS — Z9223 Personal history of estrogen therapy: Secondary | ICD-10-CM | POA: Insufficient documentation

## 2016-08-22 DIAGNOSIS — I10 Essential (primary) hypertension: Secondary | ICD-10-CM | POA: Diagnosis not present

## 2016-08-22 DIAGNOSIS — C50812 Malignant neoplasm of overlapping sites of left female breast: Secondary | ICD-10-CM | POA: Insufficient documentation

## 2016-08-22 DIAGNOSIS — Z853 Personal history of malignant neoplasm of breast: Secondary | ICD-10-CM | POA: Insufficient documentation

## 2016-08-22 DIAGNOSIS — C801 Malignant (primary) neoplasm, unspecified: Secondary | ICD-10-CM

## 2016-08-22 LAB — COMPREHENSIVE METABOLIC PANEL
ALT: 23 U/L (ref 14–54)
AST: 24 U/L (ref 15–41)
Albumin: 4.3 g/dL (ref 3.5–5.0)
Alkaline Phosphatase: 89 U/L (ref 38–126)
Anion gap: 7 (ref 5–15)
BUN: 22 mg/dL — ABNORMAL HIGH (ref 6–20)
CO2: 24 mmol/L (ref 22–32)
Calcium: 10.1 mg/dL (ref 8.9–10.3)
Chloride: 108 mmol/L (ref 101–111)
Creatinine, Ser: 0.77 mg/dL (ref 0.44–1.00)
GFR calc Af Amer: >60 mL/min (ref >60)
GFR calc non Af Amer: >60 mL/min (ref >60)
Glucose, Bld: 114 mg/dL — ABNORMAL HIGH (ref 65–99)
Potassium: 4.1 mmol/L (ref 3.5–5.1)
Sodium: 139 mmol/L (ref 135–145)
Total Bilirubin: 0.8 mg/dL (ref 0.3–1.2)
Total Protein: 7.1 g/dL (ref 6.5–8.1)

## 2016-08-22 LAB — CBC WITH DIFFERENTIAL/PLATELET
Basophils Absolute: 0 10*3/uL (ref 0–0.1)
Basophils Relative: 1 %
Eosinophils Absolute: 0.2 10*3/uL (ref 0–0.7)
Eosinophils Relative: 2 %
HCT: 36.1 % (ref 35.0–47.0)
Hemoglobin: 12.9 g/dL (ref 12.0–16.0)
Lymphocytes Relative: 30 %
Lymphs Abs: 2.1 10*3/uL (ref 1.0–3.6)
MCH: 31.5 pg (ref 26.0–34.0)
MCHC: 35.8 g/dL (ref 32.0–36.0)
MCV: 88 fL (ref 80.0–100.0)
Monocytes Absolute: 0.6 10*3/uL (ref 0.2–0.9)
Monocytes Relative: 8 %
Neutro Abs: 4.1 10*3/uL (ref 1.4–6.5)
Neutrophils Relative %: 59 %
Platelets: 263 10*3/uL (ref 150–440)
RBC: 4.1 MIL/uL (ref 3.80–5.20)
RDW: 13.3 % (ref 11.5–14.5)
WBC: 6.9 10*3/uL (ref 3.6–11.0)

## 2016-08-22 NOTE — Assessment & Plan Note (Signed)
Stage I breast cancer ER/PR positive on inhibitor Oncotype low risk; status post 5 years of aromatase inhibitor finished in 2016. Currently on surveillance. Clinically no evidence of recurrence. Last mammogram oct 2016; will order one today.  # Labs within normal limits. Follow-up in one year/labs.

## 2016-08-22 NOTE — Progress Notes (Signed)
Last Mammo a year ago

## 2016-08-22 NOTE — Progress Notes (Signed)
[facility] OFFICE PROGRESS NOTE  Patient Care Team: Idelle Crouch, MD as PCP - General (Internal Medicine)  No matching staging information was found for the patient.   Oncology History   1. Carcinoma of breast, status post lumpectomy and sentinel lymph node biopsy on Mar 09 2009. MammoSite T. for local radiation treatment T1 C., N0, M0 tumor.  Estrogen receptor positive, progesterone receptor positive HER-2 receptor negative Oncotype DX score was low (10) started on Femara on April 30, 2009 3/patient was taken off  [place] in August of 2015 as patient has finished her 5 years of anti-hormonal therapy     Carcinoma of overlapping sites of left breast in female, estrogen receptor positive (Tiffany Skinner)      This is my first interaction with the patient as patient's primary oncologist has been Dr.Choksi. I reviewed the patient's prior charts/pertinent labs/imaging in detail; findings are summarized above.     INTERVAL HISTORY:  Tiffany Skinner 75 y.o.  female pleasant patient above history of Breast cancer is here for follow-up.  Patient denies any lumps or bumps. Denies any unusual bone pain. No nausea or vomiting. No headaches.  REVIEW OF SYSTEMS:  A complete 10 point review of system is done which is negative except mentioned above/history of present illness.   PAST MEDICAL HISTORY :  Past Medical History:  Diagnosis Date  . Anemia   . Arthritis   . Breast cancer ([place])   . Hypertension     PAST SURGICAL HISTORY :   Past Surgical History:  Procedure Laterality Date  . BREAST LUMPECTOMY Left   . TUBAL LIGATION      FAMILY HISTORY :   Family History  Problem Relation Age of Onset  . Stroke Mother   . Stroke Father     SOCIAL HISTORY:   Social History  Substance Use Topics  . Smoking status: Former Research scientist (life sciences)  . Smokeless tobacco: Never Used  . Alcohol use No    ALLERGIES:  has No Known Allergies.  MEDICATIONS:  Current Outpatient Prescriptions   Medication Sig Dispense Refill  . aspirin EC 81 MG tablet Take by mouth.    Marland Kitchen atorvastatin (LIPITOR) 20 MG tablet Take 1 tablet by mouth once daily    . calcium carbonate (OS-CAL - DOSED IN MG OF ELEMENTAL CALCIUM) 1250 (500 CA) MG tablet Take by mouth.    . losartan-hydrochlorothiazide (HYZAAR) 100-12.5 MG per tablet Take 1 tablet by mouth once daily    . Multiple Vitamin (MULTIVITAMIN) capsule Take by mouth.    . Multiple Vitamins-Minerals (PRESERVISION AREDS 2 PO) Take by mouth.     No current facility-administered medications for this visit.     PHYSICAL EXAMINATION: ECOG PERFORMANCE STATUS: 0 - Asymptomatic  BP (!) 143/82 (BP Location: Left Arm, Patient Position: Sitting)   Pulse 81   Temp (!) 96.3 F (35.7 C) (Tympanic)   Resp 16   Ht 6' 5"  (1.956 m)   Wt 187 lb 12.8 oz (85.2 kg)   BMI 22.27 kg/m   Filed Weights   08/22/16 1224  Weight: 187 lb 12.8 oz (85.2 kg)    GENERAL: Well-nourished well-developed; Alert, no distress and comfortable.   Alone.  EYES: no pallor or icterus OROPHARYNX: no thrush or ulceration; good dentition  NECK: supple, no masses felt LYMPH:  no palpable lymphadenopathy in the cervical, axillary or inguinal regions LUNGS: clear to auscultation and  No wheeze or crackles HEART/CVS: regular rate & rhythm and no murmurs; No  lower extremity edema ABDOMEN:abdomen soft, non-tender and normal bowel sounds Musculoskeletal:no cyanosis of digits and no clubbing  PSYCH: alert & oriented x 3 with fluent speech NEURO: no focal motor/sensory deficits SKIN:  no rashes or significant lesions  Right and left BREAST exam [in the presence of nurse]- no unusual skin changes or dominant masses felt. Surgical scars noted.    LABORATORY DATA:  I have reviewed the data as listed    Component Value Date/Time   NA 139 08/22/2016 1056   NA 141 06/26/2014 1045   K 4.1 08/22/2016 1056   K 4.1 06/26/2014 1045   CL 108 08/22/2016 1056   CL 104 06/26/2014 1045    CO2 24 08/22/2016 1056   CO2 28 06/26/2014 1045   GLUCOSE 114 (H) 08/22/2016 1056   GLUCOSE 101 (H) 06/26/2014 1045   BUN 22 (H) 08/22/2016 1056   BUN 14 06/26/2014 1045   CREATININE 0.77 08/22/2016 1056   CREATININE 0.94 06/26/2014 1045   CALCIUM 10.1 08/22/2016 1056   CALCIUM 9.8 06/26/2014 1045   PROT 7.1 08/22/2016 1056   PROT 7.2 06/26/2014 1045   ALBUMIN 4.3 08/22/2016 1056   ALBUMIN 3.8 06/26/2014 1045   AST 24 08/22/2016 1056   AST 22 06/26/2014 1045   ALT 23 08/22/2016 1056   ALT 32 06/26/2014 1045   ALKPHOS 89 08/22/2016 1056   ALKPHOS 99 06/26/2014 1045   BILITOT 0.8 08/22/2016 1056   BILITOT 0.6 06/26/2014 1045   GFRNONAA >60 08/22/2016 1056   GFRNONAA >60 06/26/2014 1045   GFRAA >60 08/22/2016 1056   GFRAA >60 06/26/2014 1045    No results found for: SPEP, UPEP  Lab Results  Component Value Date   WBC 6.9 08/22/2016   NEUTROABS 4.1 08/22/2016   HGB 12.9 08/22/2016   HCT 36.1 08/22/2016   MCV 88.0 08/22/2016   PLT 263 08/22/2016      Chemistry      Component Value Date/Time   NA 139 08/22/2016 1056   NA 141 06/26/2014 1045   K 4.1 08/22/2016 1056   K 4.1 06/26/2014 1045   CL 108 08/22/2016 1056   CL 104 06/26/2014 1045   CO2 24 08/22/2016 1056   CO2 28 06/26/2014 1045   BUN 22 (H) 08/22/2016 1056   BUN 14 06/26/2014 1045   CREATININE 0.77 08/22/2016 1056   CREATININE 0.94 06/26/2014 1045      Component Value Date/Time   CALCIUM 10.1 08/22/2016 1056   CALCIUM 9.8 06/26/2014 1045   ALKPHOS 89 08/22/2016 1056   ALKPHOS 99 06/26/2014 1045   AST 24 08/22/2016 1056   AST 22 06/26/2014 1045   ALT 23 08/22/2016 1056   ALT 32 06/26/2014 1045   BILITOT 0.8 08/22/2016 1056   BILITOT 0.6 06/26/2014 1045       RADIOGRAPHIC STUDIES: I have personally reviewed the radiological images as listed and agreed with the findings in the report. No results found.   ASSESSMENT & PLAN:  Carcinoma of overlapping sites of left breast in female, estrogen  receptor positive ([place]) Stage I breast cancer ER/PR positive on inhibitor Oncotype low risk; status post 5 years of aromatase inhibitor finished in 2016. Currently on surveillance. Clinically no evidence of recurrence. Last mammogram oct 2016; will order one today.  # Labs within normal limits. Follow-up in one year/labs.   Orders Placed This Encounter  Procedures  . MM Digital Diagnostic Bilat    Standing Status:   Future    Standing Expiration Date:  08/22/2017    Order Specific Question:   Reason for Exam (SYMPTOM  OR DIAGNOSIS REQUIRED)    Answer:   History of left breast cancer status    Order Specific Question:   Preferred imaging location?    Answer:   [facility]  . US Breast Complete [place] Ax[place]    Standing Status:   Future    Standing Expiration Date:   10/22/2017    Order Specific Question:   Reason for Exam (SYMPTOM  OR DIAGNOSIS REQUIRED)    Answer:   History of left breast cancer status    Order Specific Question:   Preferred imaging location?    Answer:   [facility]  . US Breast Complete [place] [facility] [place] Axilla    Standing Status:   Future    Standing Expiration Date:   10/22/2017    Order Specific Question:   Reason for Exam (SYMPTOM  OR DIAGNOSIS REQUIRED)    Answer:   History of left breast cancer    Order Specific Question:   Preferred imaging location?    [facility] CBC with Differential    Standing Status:   Future    Standing Expiration Date:   02/20/2018  . Comprehensive metabolic panel    Standing Status:   Future    Standing Expiration Date:   02/20/2018   All questions were answered. The patient knows to call the clinic with any problems, questions or concerns.      Cammie Sickle, MD 08/22/2016 5:45 PM

## 2016-08-24 ENCOUNTER — Ambulatory Visit: Payer: PPO | Admitting: Internal Medicine

## 2016-09-01 DIAGNOSIS — H353132 Nonexudative age-related macular degeneration, bilateral, intermediate dry stage: Secondary | ICD-10-CM | POA: Diagnosis not present

## 2016-09-01 DIAGNOSIS — H524 Presbyopia: Secondary | ICD-10-CM | POA: Diagnosis not present

## 2016-09-01 DIAGNOSIS — H2513 Age-related nuclear cataract, bilateral: Secondary | ICD-10-CM | POA: Diagnosis not present

## 2016-09-08 ENCOUNTER — Other Ambulatory Visit: Payer: PPO

## 2016-09-08 ENCOUNTER — Ambulatory Visit: Payer: PPO

## 2016-09-09 ENCOUNTER — Other Ambulatory Visit: Payer: Self-pay | Admitting: Internal Medicine

## 2016-09-09 ENCOUNTER — Ambulatory Visit
Admission: RE | Admit: 2016-09-09 | Discharge: 2016-09-09 | Disposition: A | Discharge status: Home / Self Care | Payer: PPO | Source: Ambulatory Visit | Attending: Internal Medicine | Admitting: Internal Medicine

## 2016-09-09 DIAGNOSIS — C50812 Malignant neoplasm of overlapping sites of left female breast: Secondary | ICD-10-CM

## 2016-09-09 DIAGNOSIS — R928 Other abnormal and inconclusive findings on diagnostic imaging of breast: Secondary | ICD-10-CM | POA: Diagnosis not present

## 2016-09-09 DIAGNOSIS — Z170 Estrogen receptor positive status [ER+]: Secondary | ICD-10-CM | POA: Diagnosis not present

## 2016-11-22 DIAGNOSIS — Z79899 Other long term (current) drug therapy: Secondary | ICD-10-CM | POA: Diagnosis not present

## 2016-11-22 DIAGNOSIS — I10 Essential (primary) hypertension: Secondary | ICD-10-CM | POA: Diagnosis not present

## 2016-11-22 DIAGNOSIS — E559 Vitamin D deficiency, unspecified: Secondary | ICD-10-CM | POA: Diagnosis not present

## 2016-11-22 DIAGNOSIS — E7800 Pure hypercholesterolemia, unspecified: Secondary | ICD-10-CM | POA: Diagnosis not present

## 2016-11-22 DIAGNOSIS — E538 Deficiency of other specified B group vitamins: Secondary | ICD-10-CM | POA: Diagnosis not present

## 2016-11-29 DIAGNOSIS — Z79899 Other long term (current) drug therapy: Secondary | ICD-10-CM | POA: Diagnosis not present

## 2016-11-29 DIAGNOSIS — Z13820 Encounter for screening for osteoporosis: Secondary | ICD-10-CM | POA: Diagnosis not present

## 2016-11-29 DIAGNOSIS — E559 Vitamin D deficiency, unspecified: Secondary | ICD-10-CM | POA: Diagnosis not present

## 2016-11-29 DIAGNOSIS — E7800 Pure hypercholesterolemia, unspecified: Secondary | ICD-10-CM | POA: Diagnosis not present

## 2016-11-29 DIAGNOSIS — C50919 Malignant neoplasm of unspecified site of unspecified female breast: Secondary | ICD-10-CM | POA: Diagnosis not present

## 2016-11-29 DIAGNOSIS — I10 Essential (primary) hypertension: Secondary | ICD-10-CM | POA: Diagnosis not present

## 2016-11-29 DIAGNOSIS — Z0000 Encounter for general adult medical examination without abnormal findings: Secondary | ICD-10-CM | POA: Diagnosis not present

## 2016-12-07 DIAGNOSIS — M8588 Other specified disorders of bone density and structure, other site: Secondary | ICD-10-CM | POA: Diagnosis not present

## 2016-12-07 DIAGNOSIS — Z13820 Encounter for screening for osteoporosis: Secondary | ICD-10-CM | POA: Diagnosis not present

## 2016-12-20 DIAGNOSIS — H02839 Dermatochalasis of unspecified eye, unspecified eyelid: Secondary | ICD-10-CM | POA: Diagnosis not present

## 2016-12-20 DIAGNOSIS — H2513 Age-related nuclear cataract, bilateral: Secondary | ICD-10-CM | POA: Diagnosis not present

## 2016-12-20 DIAGNOSIS — H353131 Nonexudative age-related macular degeneration, bilateral, early dry stage: Secondary | ICD-10-CM | POA: Diagnosis not present

## 2016-12-20 DIAGNOSIS — H18413 Arcus senilis, bilateral: Secondary | ICD-10-CM | POA: Diagnosis not present

## 2016-12-20 DIAGNOSIS — H2512 Age-related nuclear cataract, left eye: Secondary | ICD-10-CM | POA: Diagnosis not present

## 2017-02-04 IMAGING — MG 2D DIGITAL DIAGNOSTIC BILATERAL MAMMOGRAM WITH CAD AND ADJUNCT T
8 of 13 series · 8 of 29 positions shown · non-contrast
Comparison: Previous exam(s).

CLINICAL DATA: Status post left lumpectomy and MammoSite radiation
therapy in 7979 for breast cancer.

EXAM:
2D DIGITAL DIAGNOSTIC BILATERAL MAMMOGRAM WITH CAD AND ADJUNCT TOMO

[L MLO (1 of 2)]
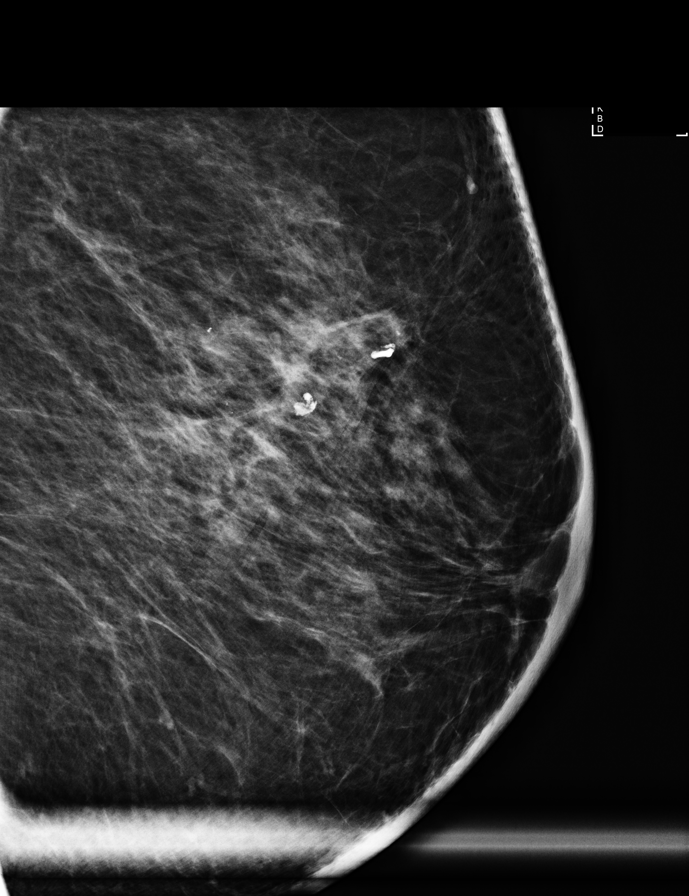

[L MLO (2 of 2)]
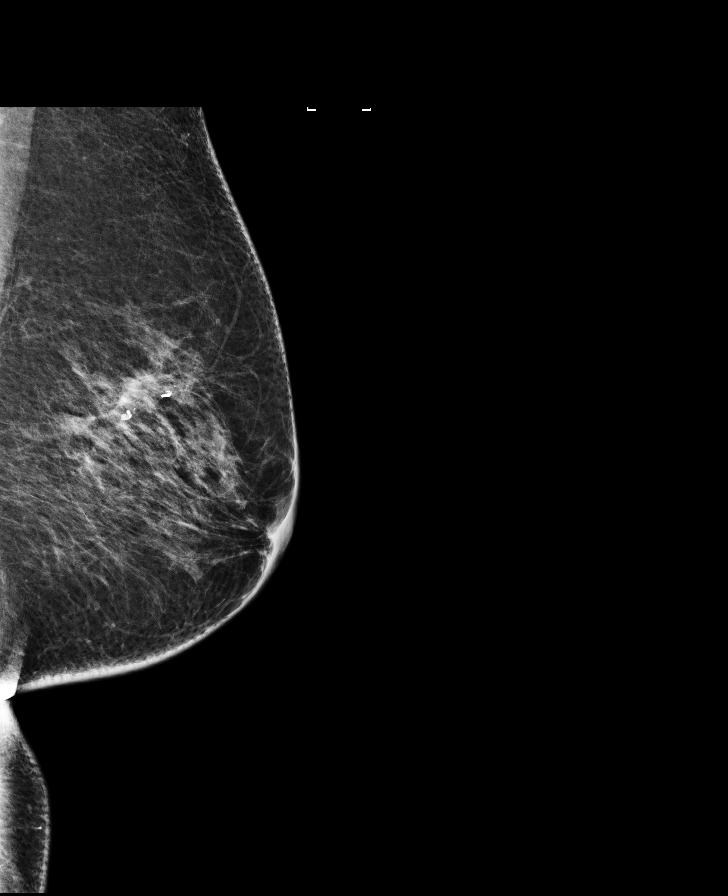

[R CC]
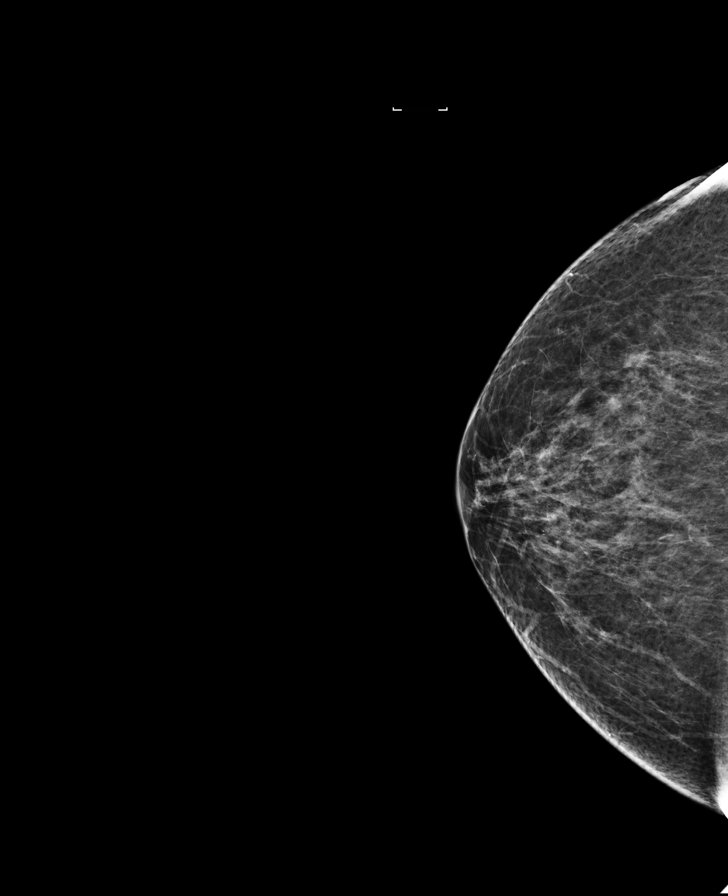

[R CC synth-2D]
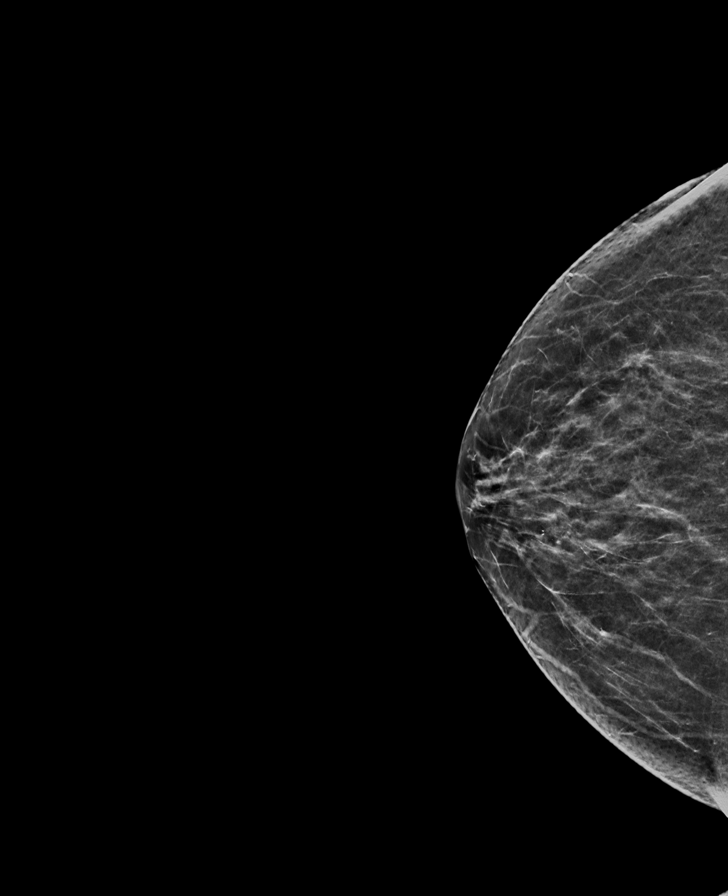

[R MLO synth-2D]
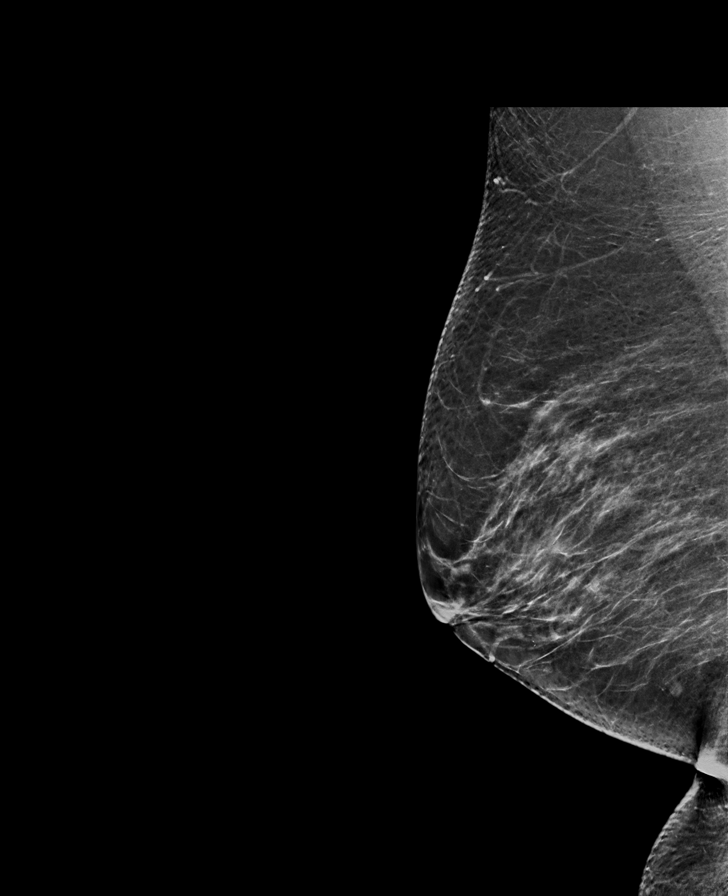

[R MLO]
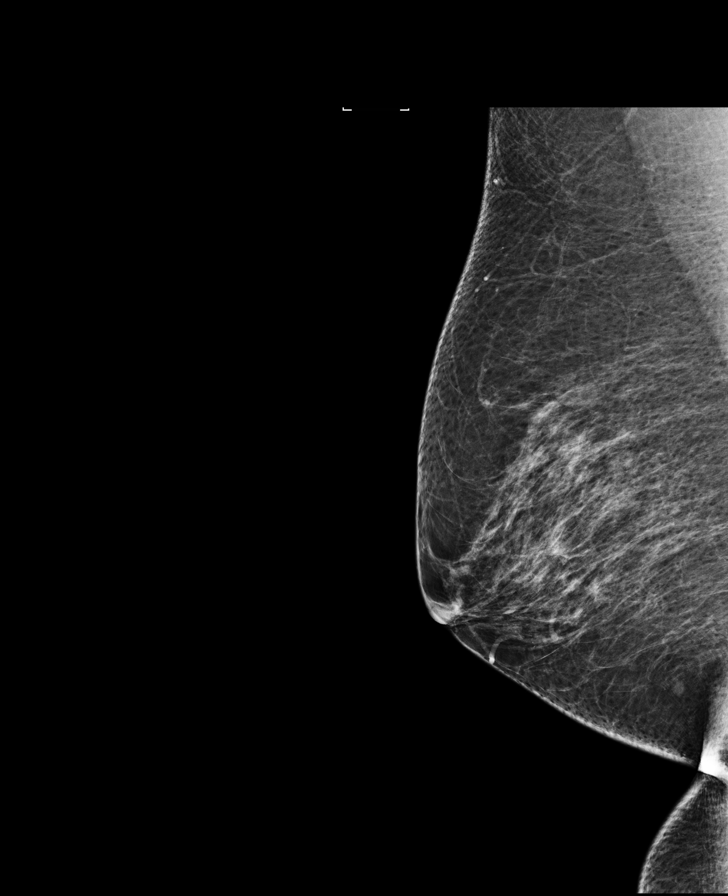

[L MLO synth-2D]
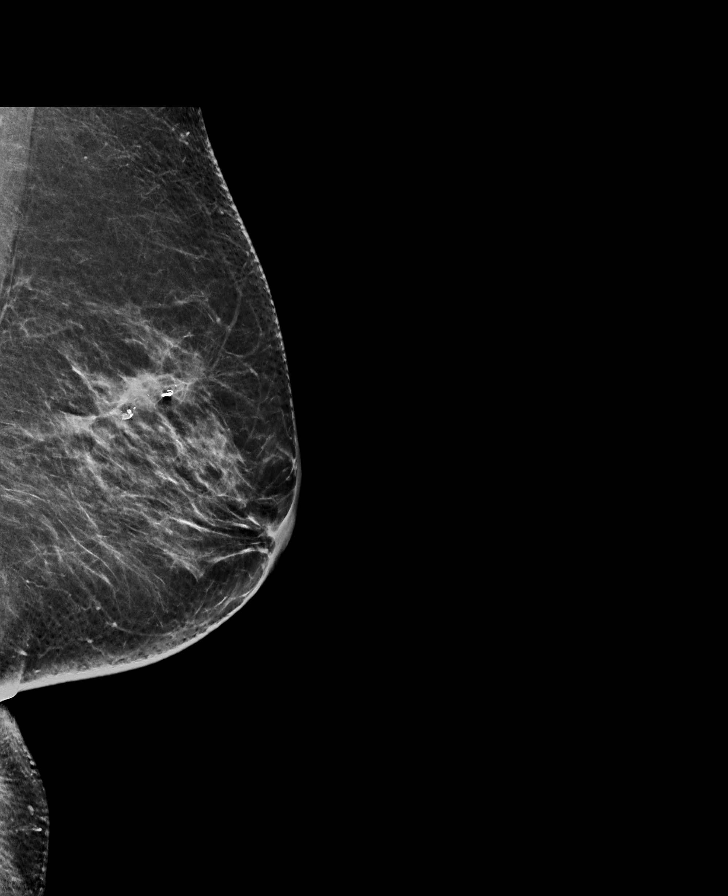

[L CC]
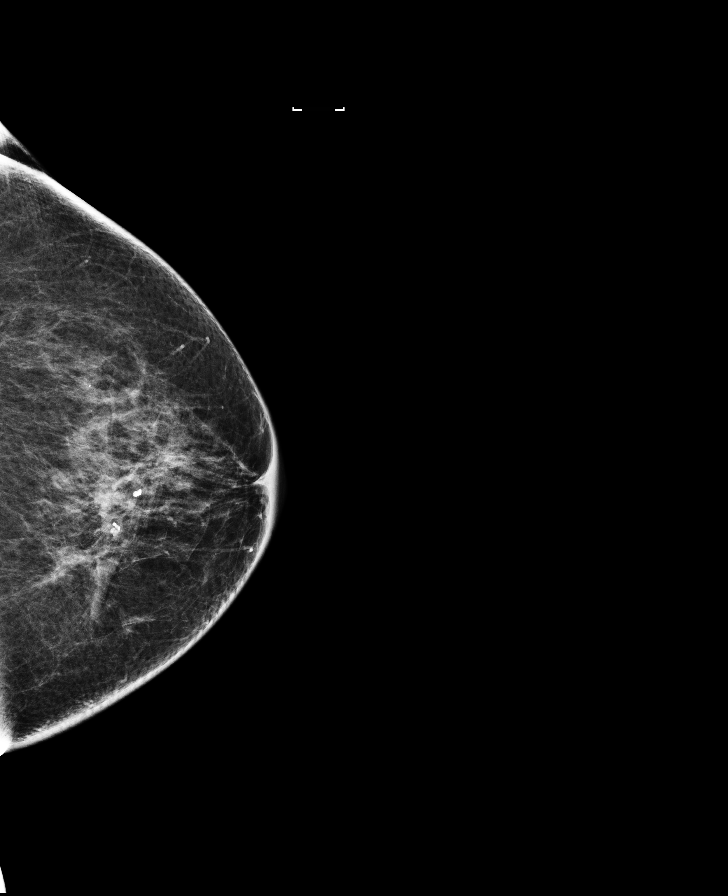

[8 of 29 positions shown; findings below may reference images not displayed]

ACR Breast Density Category b: There are scattered areas of
fibroglandular density.
FINDINGS: Stable post lumpectomy and postradiation changes on the left. No
interval findings suspicious for malignancy in either breast.

Mammographic images were processed with CAD.
IMPRESSION: No evidence of malignancy.

RECOMMENDATION:
Bilateral screening mammogram in 1 year.

I have discussed the findings and recommendations with the patient.
Results were also provided in writing at the conclusion of the
visit. If applicable, a reminder letter will be sent to the patient
regarding the next appointment.

BI-RADS CATEGORY  2: Benign.

## 2017-03-13 DIAGNOSIS — Z961 Presence of intraocular lens: Secondary | ICD-10-CM | POA: Diagnosis not present

## 2017-03-13 DIAGNOSIS — H2512 Age-related nuclear cataract, left eye: Secondary | ICD-10-CM | POA: Diagnosis not present

## 2017-03-14 DIAGNOSIS — H2511 Age-related nuclear cataract, right eye: Secondary | ICD-10-CM | POA: Diagnosis not present

## 2017-03-14 DIAGNOSIS — Z961 Presence of intraocular lens: Secondary | ICD-10-CM | POA: Diagnosis not present

## 2017-03-27 DIAGNOSIS — Z961 Presence of intraocular lens: Secondary | ICD-10-CM | POA: Diagnosis not present

## 2017-03-27 DIAGNOSIS — H2511 Age-related nuclear cataract, right eye: Secondary | ICD-10-CM | POA: Diagnosis not present

## 2017-04-25 DIAGNOSIS — H2511 Age-related nuclear cataract, right eye: Secondary | ICD-10-CM | POA: Diagnosis not present

## 2017-04-25 DIAGNOSIS — H2512 Age-related nuclear cataract, left eye: Secondary | ICD-10-CM | POA: Diagnosis not present

## 2017-04-25 DIAGNOSIS — Z961 Presence of intraocular lens: Secondary | ICD-10-CM | POA: Diagnosis not present

## 2017-05-22 DIAGNOSIS — Z79899 Other long term (current) drug therapy: Secondary | ICD-10-CM | POA: Diagnosis not present

## 2017-05-22 DIAGNOSIS — E559 Vitamin D deficiency, unspecified: Secondary | ICD-10-CM | POA: Diagnosis not present

## 2017-05-22 DIAGNOSIS — I10 Essential (primary) hypertension: Secondary | ICD-10-CM | POA: Diagnosis not present

## 2017-05-22 DIAGNOSIS — E7800 Pure hypercholesterolemia, unspecified: Secondary | ICD-10-CM | POA: Diagnosis not present

## 2017-05-26 DIAGNOSIS — Z86010 Personal history of colonic polyps: Secondary | ICD-10-CM | POA: Diagnosis not present

## 2017-05-29 DIAGNOSIS — I10 Essential (primary) hypertension: Secondary | ICD-10-CM | POA: Diagnosis not present

## 2017-05-29 DIAGNOSIS — E559 Vitamin D deficiency, unspecified: Secondary | ICD-10-CM | POA: Diagnosis not present

## 2017-05-29 DIAGNOSIS — Z79899 Other long term (current) drug therapy: Secondary | ICD-10-CM | POA: Diagnosis not present

## 2017-05-29 DIAGNOSIS — E7800 Pure hypercholesterolemia, unspecified: Secondary | ICD-10-CM | POA: Diagnosis not present

## 2017-05-29 DIAGNOSIS — Z0000 Encounter for general adult medical examination without abnormal findings: Secondary | ICD-10-CM | POA: Diagnosis not present

## 2017-08-14 DIAGNOSIS — R05 Cough: Secondary | ICD-10-CM | POA: Diagnosis not present

## 2017-08-14 DIAGNOSIS — J019 Acute sinusitis, unspecified: Secondary | ICD-10-CM | POA: Diagnosis not present

## 2017-08-18 ENCOUNTER — Encounter: Payer: Self-pay | Admitting: *Deleted

## 2017-08-21 ENCOUNTER — Encounter
Admission: RE | Disposition: A | Discharge status: Home / Self Care | Payer: Self-pay | Source: Ambulatory Visit | Attending: Unknown Physician Specialty

## 2017-08-21 ENCOUNTER — Ambulatory Visit: Payer: PPO | Admitting: Anesthesiology

## 2017-08-21 ENCOUNTER — Ambulatory Visit
Admission: RE | Admit: 2017-08-21 | Discharge: 2017-08-21 | Disposition: A | Discharge status: Home / Self Care | Payer: PPO | Source: Ambulatory Visit | Attending: Unknown Physician Specialty | Admitting: Unknown Physician Specialty

## 2017-08-21 DIAGNOSIS — Z6830 Body mass index (BMI) 30.0-30.9, adult: Secondary | ICD-10-CM | POA: Insufficient documentation

## 2017-08-21 DIAGNOSIS — Z87891 Personal history of nicotine dependence: Secondary | ICD-10-CM | POA: Diagnosis not present

## 2017-08-21 DIAGNOSIS — K640 First degree hemorrhoids: Secondary | ICD-10-CM | POA: Insufficient documentation

## 2017-08-21 DIAGNOSIS — Z86010 Personal history of colonic polyps: Secondary | ICD-10-CM | POA: Insufficient documentation

## 2017-08-21 DIAGNOSIS — Z853 Personal history of malignant neoplasm of breast: Secondary | ICD-10-CM | POA: Diagnosis not present

## 2017-08-21 DIAGNOSIS — Z1211 Encounter for screening for malignant neoplasm of colon: Secondary | ICD-10-CM | POA: Insufficient documentation

## 2017-08-21 DIAGNOSIS — I10 Essential (primary) hypertension: Secondary | ICD-10-CM | POA: Insufficient documentation

## 2017-08-21 DIAGNOSIS — Z7982 Long term (current) use of aspirin: Secondary | ICD-10-CM | POA: Diagnosis not present

## 2017-08-21 DIAGNOSIS — E669 Obesity, unspecified: Secondary | ICD-10-CM | POA: Insufficient documentation

## 2017-08-21 DIAGNOSIS — M1990 Unspecified osteoarthritis, unspecified site: Secondary | ICD-10-CM | POA: Diagnosis not present

## 2017-08-21 DIAGNOSIS — D649 Anemia, unspecified: Secondary | ICD-10-CM | POA: Diagnosis not present

## 2017-08-21 DIAGNOSIS — K5790 Diverticulosis of intestine, part unspecified, without perforation or abscess without bleeding: Secondary | ICD-10-CM | POA: Diagnosis not present

## 2017-08-21 DIAGNOSIS — E785 Hyperlipidemia, unspecified: Secondary | ICD-10-CM | POA: Insufficient documentation

## 2017-08-21 DIAGNOSIS — K5730 Diverticulosis of large intestine without perforation or abscess without bleeding: Secondary | ICD-10-CM | POA: Diagnosis not present

## 2017-08-21 DIAGNOSIS — Z79899 Other long term (current) drug therapy: Secondary | ICD-10-CM | POA: Insufficient documentation

## 2017-08-21 DIAGNOSIS — K648 Other hemorrhoids: Secondary | ICD-10-CM | POA: Diagnosis not present

## 2017-08-21 HISTORY — DX: Personal history of colonic polyps: Z86.010

## 2017-08-21 HISTORY — PX: COLONOSCOPY WITH PROPOFOL: SHX5780

## 2017-08-21 HISTORY — DX: Hyperlipidemia, unspecified: E78.5

## 2017-08-21 HISTORY — DX: Personal history of colon polyps, unspecified: Z86.0100

## 2017-08-21 HISTORY — DX: Personal history of other diseases of the digestive system: Z87.19

## 2017-08-21 HISTORY — DX: Allergy, unspecified, initial encounter: T78.40XA

## 2017-08-21 SURGERY — COLONOSCOPY WITH PROPOFOL
Anesthesia: General

## 2017-08-21 MED ORDER — SODIUM CHLORIDE 0.9 % IV SOLN
INTRAVENOUS | Status: DC
  Administered 2017-08-21: 1000 mL via INTRAVENOUS

## 2017-08-21 MED ORDER — PROPOFOL 10 MG/ML IV BOLUS
INTRAVENOUS | Status: DC | PRN
  Administered 2017-08-21: 70 mg via INTRAVENOUS

## 2017-08-21 MED ORDER — PROPOFOL 500 MG/50ML IV EMUL
INTRAVENOUS | Status: DC | PRN
  Administered 2017-08-21: 140 ug/kg/min via INTRAVENOUS

## 2017-08-21 MED ORDER — LIDOCAINE HCL (CARDIAC) 20 MG/ML IV SOLN
INTRAVENOUS | Status: DC | PRN
  Administered 2017-08-21: 30 mg via INTRAVENOUS

## 2017-08-21 MED ORDER — SODIUM CHLORIDE 0.9 % IV SOLN: INTRAVENOUS | Status: DC

## 2017-08-21 NOTE — Anesthesia Post-op Follow-up Note (Signed)
Anesthesia QCDR form completed.        

## 2017-08-21 NOTE — Op Note (Signed)
[facility] Gastroenterology Patient Name: Tiffany Skinner Procedure Date: 08/21/2017 10:21 AM MRN: [id number] Account #: [id number] Date of Birth: 02/24/1941 Admit Type: Outpatient Age: 76 Room: [facility] ENDO ROOM 3 Gender: Female Note Status: Finalized Procedure:            Colonoscopy Indications:          High risk colon cancer surveillance: Personal history                        of colonic polyps Providers:            Manya Silvas, MD Referring MD:         Leonie Douglas. Doy Hutching, MD (Referring MD) Medicines:            Propofol per Anesthesia Complications:        No immediate complications. Procedure:            Pre-Anesthesia Assessment:                       - After reviewing the risks and benefits, the patient                        was deemed in satisfactory condition to undergo the                        procedure.                       After obtaining informed consent, the colonoscope was                        passed under direct vision. Throughout the procedure,                        the patient's blood pressure, pulse, and oxygen                        saturations were monitored continuously. The                        Colonoscope was introduced through the anus and                        advanced to the the cecum, identified by appendiceal                        orifice and ileocecal valve. The colonoscopy was                        somewhat difficult due to a tortuous colon. Successful                        completion of the procedure was aided by applying                        abdominal pressure. The patient tolerated the procedure                        well. The quality of the bowel preparation was  excellent. Findings:      Multiple small-mouthed diverticula were found in the sigmoid colon and       descending colon.      Internal hemorrhoids were found during endoscopy. The hemorrhoids were       small and Grade I  (internal hemorrhoids that do not prolapse).      The exam was otherwise without abnormality. Impression:           - Diverticulosis in the sigmoid colon and in the                        descending colon.                       - Internal hemorrhoids.                       - The examination was otherwise normal.                       - No specimens collected. Recommendation:       - Repeat colonoscopy in 5 years for screening purposes. Manya Silvas, MD 08/21/2017 10:47:44 AM This report has been signed electronically. Number of Addenda: 0 Note Initiated On: 08/21/2017 10:21 AM Scope Withdrawal Time: 0 hours 8 minutes 57 seconds  Total Procedure Duration: 0 hours 19 minutes 13 seconds       [facility]

## 2017-08-21 NOTE — Transfer of Care (Signed)
Immediate Anesthesia Transfer of Care Note  Patient: Tiffany Skinner  Procedure(s) Performed: COLONOSCOPY WITH PROPOFOL (N/A )  Patient Location: PACU  Anesthesia Type:General  Level of Consciousness: awake, alert  and oriented  Airway & Oxygen Therapy: Patient Spontanous Breathing and Patient connected to nasal cannula oxygen  Post-op Assessment: Report given to RN and Post -op Vital signs reviewed and stable  Post vital signs: Reviewed and stable  Last Vitals:  Vitals:   08/21/17 1048 08/21/17 1049  BP: 100/87 100/87  Pulse: 76 86  Resp: (!) 23 15  Temp: 37.1 C 37.1 C  SpO2: 96% 98%    Last Pain:  Vitals:   08/21/17 1049  TempSrc: Tympanic         Complications: No apparent anesthesia complications

## 2017-08-21 NOTE — H&P (Signed)
   Primary Care Physician:  Idelle Crouch, MD Primary Gastroenterologist:  Dr. Vira Agar  Pre-Procedure History & Physical: HPI:  Tiffany Skinner is a 76 y.o. female is here for an colonoscopy.   Past Medical History:  Diagnosis Date  . Allergic state   . Anemia   . Arthritis   . Breast cancer ([place])   . History of colonic diverticulitis   . Hx of colonic polyp   . Hyperlipidemia   . Hypertension     Past Surgical History:  Procedure Laterality Date  . BREAST LUMPECTOMY Left 2010  . COLONOSCOPY  04/24/2012  . TUBAL LIGATION      Prior to Admission medications   Medication Sig Start Date End Date Taking? Authorizing Provider  aspirin EC 81 MG tablet Take by mouth.   Yes [provider]  atorvastatin (LIPITOR) 20 MG tablet Take 1 tablet by mouth once daily 06/22/15  Yes [provider]  calcium carbonate (OS-CAL - DOSED IN MG OF ELEMENTAL CALCIUM) 1250 (500 CA) MG tablet Take by mouth.   Yes [provider]  cholecalciferol (VITAMIN D) 400 units TABS tablet Take 1,000 Units by mouth daily.   Yes [provider]  cyanocobalamin 1000 MCG tablet Take 1,000 mcg by mouth daily.   Yes [provider]  losartan-hydrochlorothiazide (HYZAAR) 100-12.5 MG per tablet Take 1 tablet by mouth once daily 06/22/15  Yes [provider]  Multiple Vitamin (MULTIVITAMIN) capsule Take by mouth.   Yes [provider]  Multiple Vitamins-Minerals (PRESERVISION AREDS 2 PO) Take by mouth.   Yes [provider]  polyethylene glycol powder (GLYCOLAX/MIRALAX) powder Take 1 Container by mouth once.   Yes [provider]    Allergies as of 06/06/2017  . (No Known Allergies)    Family History  Problem Relation Age of Onset  . Stroke Mother   . Stroke Father     Social History   Social History  . Marital status: Married    Spouse name: N/A  . Number of children: N/A  . Years of education: N/A   Occupational  History  . Not on file.   Social History Main Topics  . Smoking status: Former Research scientist (life sciences)  . Smokeless tobacco: Never Used  . Alcohol use 0.0 oz/week  . Drug use: No  . Sexual activity: Not on file   Other Topics Concern  . Not on file   Social History Narrative  . No narrative on file    Review of Systems: See HPI, otherwise negative ROS  Physical Exam: BP 135/72   Pulse 86   Temp 97.9 F (36.6 C) (Tympanic)   Resp 16   Ht 5\' 4"  (1.626 m)   Wt 79.4 kg (175 lb)   SpO2 99%   BMI 30.04 kg/m  General:   Alert,  pleasant and cooperative in NAD Head:  Normocephalic and atraumatic. Neck:  Supple; no masses or thyromegaly. Lungs:  Clear throughout to auscultation.    Heart:  Regular rate and rhythm. Abdomen:  Soft, nontender and nondistended. Normal bowel sounds, without guarding, and without rebound.   Neurologic:  Alert and  oriented x4;  grossly normal neurologically.  Impression/Plan: BRITTINEY DICOSTANZO is here for an colonoscopy to be performed for [facility] colon polyps.  Risks, benefits, limitations, and alternatives regarding  colonoscopy have been reviewed with the patient.  Questions have been answered.  All parties agreeable.   Gaylyn Cheers, MD  08/21/2017, 10:18 AM

## 2017-08-21 NOTE — Anesthesia Postprocedure Evaluation (Signed)
Anesthesia Post Note  Patient: Tiffany Skinner  Procedure(s) Performed: COLONOSCOPY WITH PROPOFOL (N/A )  Patient location during evaluation: Endoscopy Anesthesia Type: General Level of consciousness: awake and alert and oriented Pain management: pain level controlled Vital Signs Assessment: post-procedure vital signs reviewed and stable Respiratory status: spontaneous breathing, nonlabored ventilation and respiratory function stable Cardiovascular status: blood pressure returned to baseline and stable Postop Assessment: no signs of nausea or vomiting Anesthetic complications: no     Last Vitals:  Vitals:   08/21/17 1058 08/21/17 1108  BP: 104/72 115/65  Pulse: 78 75  Resp: 19 18  Temp:    SpO2: 97% 97%    Last Pain:  Vitals:   08/21/17 1049  TempSrc: Tympanic                 Yash Cacciola

## 2017-08-21 NOTE — Anesthesia Preprocedure Evaluation (Signed)
Anesthesia Evaluation  Patient identified by MRN, date of birth, ID band Patient awake    Reviewed: Allergy & Precautions, NPO status , Patient's Chart, lab work & pertinent test results  History of Anesthesia Complications Negative for: history of anesthetic complications  Airway Mallampati: II  TM Distance: >3 FB Neck ROM: Full    Dental  (+) Partial Upper   Pulmonary neg sleep apnea, neg COPD, former smoker,    breath sounds clear to auscultation- rhonchi (-) wheezing      Cardiovascular Exercise Tolerance: Good hypertension, Pt. on medications (-) CAD, (-) Past MI and (-) Cardiac Stents  Rhythm:Regular Rate:Normal - Systolic murmurs and - Diastolic murmurs    Neuro/Psych negative neurological ROS  negative psych ROS   GI/Hepatic negative GI ROS, Neg liver ROS,   Endo/Other  negative endocrine ROSneg diabetes  Renal/GU negative Renal ROS     Musculoskeletal  (+) Arthritis ,   Abdominal (+) + obese,   Peds  Hematology  (+) anemia ,   Anesthesia Other Findings Past Medical History: No date: Allergic state No date: Anemia No date: Arthritis No date: Breast cancer ([place]) No date: History of colonic diverticulitis No date: Hx of colonic polyp No date: Hyperlipidemia No date: Hypertension   Reproductive/Obstetrics                             Anesthesia Physical Anesthesia Plan  ASA: II  Anesthesia Plan: General   Post-op Pain Management:    Induction: Intravenous  PONV Risk Score and Plan: 2 and Propofol infusion  Airway Management Planned: Natural Airway  Additional Equipment:   Intra-op Plan:   Post-operative Plan:   Informed Consent: I have reviewed the patients History and Physical, chart, labs and discussed the procedure including the risks, benefits and alternatives for the proposed anesthesia with the patient or authorized representative who has indicated his/her  understanding and acceptance.   Dental advisory given  Plan Discussed with: CRNA and Anesthesiologist  Anesthesia Plan Comments:         Anesthesia Quick Evaluation

## 2017-08-22 ENCOUNTER — Encounter: Payer: Self-pay | Admitting: Unknown Physician Specialty

## 2017-08-23 ENCOUNTER — Other Ambulatory Visit: Payer: PPO

## 2017-08-23 ENCOUNTER — Ambulatory Visit: Payer: PPO | Admitting: Internal Medicine

## 2017-08-31 ENCOUNTER — Inpatient Hospital Stay (HOSPITAL_BASED_OUTPATIENT_CLINIC_OR_DEPARTMENT_OTHER): Payer: PPO | Admitting: Internal Medicine

## 2017-08-31 ENCOUNTER — Inpatient Hospital Stay: Payer: PPO | Attending: Internal Medicine

## 2017-08-31 VITALS — BP 151/86 | HR 70 | Temp 97.3°F | Resp 18 | Wt 188.4 lb

## 2017-08-31 DIAGNOSIS — Z9223 Personal history of estrogen therapy: Secondary | ICD-10-CM

## 2017-08-31 DIAGNOSIS — I10 Essential (primary) hypertension: Secondary | ICD-10-CM | POA: Insufficient documentation

## 2017-08-31 DIAGNOSIS — Z86010 Personal history of colonic polyps: Secondary | ICD-10-CM | POA: Diagnosis not present

## 2017-08-31 DIAGNOSIS — Z923 Personal history of irradiation: Secondary | ICD-10-CM | POA: Diagnosis not present

## 2017-08-31 DIAGNOSIS — Z170 Estrogen receptor positive status [ER+]: Secondary | ICD-10-CM | POA: Insufficient documentation

## 2017-08-31 DIAGNOSIS — Z853 Personal history of malignant neoplasm of breast: Secondary | ICD-10-CM

## 2017-08-31 DIAGNOSIS — C50812 Malignant neoplasm of overlapping sites of left female breast: Secondary | ICD-10-CM

## 2017-08-31 DIAGNOSIS — Z87891 Personal history of nicotine dependence: Secondary | ICD-10-CM | POA: Diagnosis not present

## 2017-08-31 DIAGNOSIS — Z79899 Other long term (current) drug therapy: Secondary | ICD-10-CM | POA: Diagnosis not present

## 2017-08-31 DIAGNOSIS — Z7982 Long term (current) use of aspirin: Secondary | ICD-10-CM | POA: Diagnosis not present

## 2017-08-31 DIAGNOSIS — E785 Hyperlipidemia, unspecified: Secondary | ICD-10-CM | POA: Insufficient documentation

## 2017-08-31 LAB — COMPREHENSIVE METABOLIC PANEL
ALT: 20 U/L (ref 14–54)
AST: 21 U/L (ref 15–41)
Albumin: 3.9 g/dL (ref 3.5–5.0)
Alkaline Phosphatase: 87 U/L (ref 38–126)
Anion gap: 9 (ref 5–15)
BUN: 14 mg/dL (ref 6–20)
CO2: 27 mmol/L (ref 22–32)
Calcium: 10 mg/dL (ref 8.9–10.3)
Chloride: 103 mmol/L (ref 101–111)
Creatinine, Ser: 0.77 mg/dL (ref 0.44–1.00)
GFR calc Af Amer: >60 mL/min (ref >60)
GFR calc non Af Amer: >60 mL/min (ref >60)
Glucose, Bld: 103 mg/dL — ABNORMAL HIGH (ref 65–99)
Potassium: 4 mmol/L (ref 3.5–5.1)
Sodium: 139 mmol/L (ref 135–145)
Total Bilirubin: 0.6 mg/dL (ref 0.3–1.2)
Total Protein: 6.8 g/dL (ref 6.5–8.1)

## 2017-08-31 LAB — CBC WITH DIFFERENTIAL/PLATELET
Basophils Absolute: 0 10*3/uL (ref 0–0.1)
Basophils Relative: 1 %
Eosinophils Absolute: 0.2 10*3/uL (ref 0–0.7)
Eosinophils Relative: 3 %
HCT: 36.5 % (ref 35.0–47.0)
Hemoglobin: 12.4 g/dL (ref 12.0–16.0)
Lymphocytes Relative: 27 %
Lymphs Abs: 1.8 10*3/uL (ref 1.0–3.6)
MCH: 30.6 pg (ref 26.0–34.0)
MCHC: 34.1 g/dL (ref 32.0–36.0)
MCV: 89.6 fL (ref 80.0–100.0)
Monocytes Absolute: 0.5 10*3/uL (ref 0.2–0.9)
Monocytes Relative: 8 %
Neutro Abs: 4.2 10*3/uL (ref 1.4–6.5)
Neutrophils Relative %: 61 %
Platelets: 246 10*3/uL (ref 150–440)
RBC: 4.07 MIL/uL (ref 3.80–5.20)
RDW: 13.3 % (ref 11.5–14.5)
WBC: 6.8 10*3/uL (ref 3.6–11.0)

## 2017-08-31 NOTE — Progress Notes (Signed)
Hx breast cancer, pt in today for yearly follow up

## 2017-08-31 NOTE — Progress Notes (Signed)
[place] OFFICE PROGRESS NOTE  Patient Care Team: Idelle Crouch, MD as PCP - General (Internal Medicine)  Cancer Staging No matching staging information was found for the patient.   Oncology History   1. Carcinoma of breast, status post lumpectomy and sentinel lymph node biopsy on Mar 09 2009. MammoSite T. for local radiation treatment T1 C., N0, M0 tumor.  Estrogen receptor positive, progesterone receptor positive HER-2 receptor negative Oncotype DX score was low (10) started on Femara on April 30, 2009 3/patient was taken off  [place] in August of 2015 as patient has finished her 5 years of anti-hormonal therapy     Carcinoma of overlapping sites of left breast in female, estrogen receptor positive ([place])     INTERVAL HISTORY: Tiffany Skinner 76 y.o.  female pleasant patient above history of Breast cancer is here for follow-up.  Patient denies any lumps or bumps. She performs self-breast exams and denies any new masses. Primary care schedules bone density scans and she recently had colonoscopy. Denies any unusual bone pain. No nausea or vomiting. No headaches. No changes to her health recently and overall feels well.   REVIEW OF SYSTEMS:  A complete 10 point review of system is done which is negative except mentioned above/history of present illness.   PAST MEDICAL HISTORY :  Past Medical History:  Diagnosis Date  . Allergic state   . Anemia   . Arthritis   . Breast cancer ([place])   . History of colonic diverticulitis   . Hx of colonic polyp   . Hyperlipidemia   . Hypertension     PAST SURGICAL HISTORY :   Past Surgical History:  Procedure Laterality Date  . BREAST LUMPECTOMY Left 2010  . COLONOSCOPY  04/24/2012  . COLONOSCOPY WITH PROPOFOL N/A 08/21/2017   Procedure: COLONOSCOPY WITH PROPOFOL;  Surgeon: Manya Silvas, MD;  Location: [facility] ENDOSCOPY;  Service: Endoscopy;  Laterality: N/A;  . TUBAL LIGATION      FAMILY HISTORY :   Family  History  Problem Relation Age of Onset  . Stroke Mother   . Stroke Father     SOCIAL HISTORY:   Social History  Substance Use Topics  . Smoking status: Former Research scientist (life sciences)  . Smokeless tobacco: Never Used  . Alcohol use 0.0 oz/week    ALLERGIES:  has No Known Allergies.  MEDICATIONS:  Current Outpatient Prescriptions  Medication Sig Dispense Refill  . aspirin EC 81 MG tablet Take by mouth.    Marland Kitchen atorvastatin (LIPITOR) 20 MG tablet Take 1 tablet by mouth once daily    . calcium carbonate (OS-CAL - DOSED IN MG OF ELEMENTAL CALCIUM) 1250 (500 CA) MG tablet Take by mouth.    . cholecalciferol (VITAMIN D) 400 units TABS tablet Take 1,000 Units by mouth daily.    . cyanocobalamin 1000 MCG tablet Take 1,000 mcg by mouth daily.    Marland Kitchen losartan-hydrochlorothiazide (HYZAAR) 100-12.5 MG per tablet Take 1 tablet by mouth once daily    . Multiple Vitamin (MULTIVITAMIN) capsule Take by mouth.    . Multiple Vitamins-Minerals (PRESERVISION AREDS 2 PO) Take by mouth.    . polyethylene glycol powder (GLYCOLAX/MIRALAX) powder Take 1 Container by mouth once.     No current facility-administered medications for this visit.     PHYSICAL EXAMINATION: ECOG PERFORMANCE STATUS: 0 - Asymptomatic  BP (!) 151/86 (BP Location: Left Arm, Patient Position: Sitting)   Pulse 70   Temp (!) 97.3 F (36.3 C) (Tympanic)  Resp 18   Wt 188 lb 6 oz (85.4 kg)   BMI 32.33 kg/m   Filed Weights   08/31/17 0955  Weight: 188 lb 6 oz (85.4 kg)    GENERAL: Well-nourished well-developed; Alert, no distress and comfortable. Alone.  EYES: no pallor or icterus OROPHARYNX: no thrush or ulceration; good dentition  NECK: supple, no masses felt LYMPH:  no palpable lymphadenopathy in the cervical, axillary or inguinal regions LUNGS: clear to auscultation and  No wheeze or crackles HEART/CVS: regular rate & rhythm and no murmurs; No lower extremity edema ABDOMEN:abdomen soft, non-tender and normal bowel  sounds Musculoskeletal:no cyanosis of digits and no clubbing  PSYCH: alert & oriented x 3 with fluent speech NEURO: no focal motor/sensory deficits SKIN:  no rashes or significant lesions BREAST: Breast exam was performed in seated and lying down position. Patient is status post left lumpectomy with a well-healed surgical scar. No evidence of any palpable masses. No evidence of axillary adenopathy. No evidence of any palpable masses or lumps in the right breast. No evidence of right axillary adenopathy. Skin thickening typical of mammosite changes felt in left breast.    LABORATORY DATA:  I have reviewed the data as listed    Component Value Date/Time   NA 139 08/31/2017 0935   NA 141 06/26/2014 1045   K 4.0 08/31/2017 0935   K 4.1 06/26/2014 1045   CL 103 08/31/2017 0935   CL 104 06/26/2014 1045   CO2 27 08/31/2017 0935   CO2 28 06/26/2014 1045   GLUCOSE 103 (H) 08/31/2017 0935   GLUCOSE 101 (H) 06/26/2014 1045   BUN 14 08/31/2017 0935   BUN 14 06/26/2014 1045   CREATININE 0.77 08/31/2017 0935   CREATININE 0.94 06/26/2014 1045   CALCIUM 10.0 08/31/2017 0935   CALCIUM 9.8 06/26/2014 1045   PROT 6.8 08/31/2017 0935   PROT 7.2 06/26/2014 1045   ALBUMIN 3.9 08/31/2017 0935   ALBUMIN 3.8 06/26/2014 1045   AST 21 08/31/2017 0935   AST 22 06/26/2014 1045   ALT 20 08/31/2017 0935   ALT 32 06/26/2014 1045   ALKPHOS 87 08/31/2017 0935   ALKPHOS 99 06/26/2014 1045   BILITOT 0.6 08/31/2017 0935   BILITOT 0.6 06/26/2014 1045   GFRNONAA >60 08/31/2017 0935   GFRNONAA >60 06/26/2014 1045   GFRAA >60 08/31/2017 0935   GFRAA >60 06/26/2014 1045    No results found for: SPEP, UPEP  Lab Results  Component Value Date   WBC 6.8 08/31/2017   NEUTROABS 4.2 08/31/2017   HGB 12.4 08/31/2017   HCT 36.5 08/31/2017   MCV 89.6 08/31/2017   PLT 246 08/31/2017      Chemistry      Component Value Date/Time   NA 139 08/31/2017 0935   NA 141 06/26/2014 1045   K 4.0 08/31/2017 0935   K  4.1 06/26/2014 1045   CL 103 08/31/2017 0935   CL 104 06/26/2014 1045   CO2 27 08/31/2017 0935   CO2 28 06/26/2014 1045   BUN 14 08/31/2017 0935   BUN 14 06/26/2014 1045   CREATININE 0.77 08/31/2017 0935   CREATININE 0.94 06/26/2014 1045      Component Value Date/Time   CALCIUM 10.0 08/31/2017 0935   CALCIUM 9.8 06/26/2014 1045   ALKPHOS 87 08/31/2017 0935   ALKPHOS 99 06/26/2014 1045   AST 21 08/31/2017 0935   AST 22 06/26/2014 1045   ALT 20 08/31/2017 0935   ALT 32 06/26/2014 1045   BILITOT  0.6 08/31/2017 0935   BILITOT 0.6 06/26/2014 1045       RADIOGRAPHIC STUDIES: I have personally reviewed the radiological images as listed and agreed with the findings in the report. No results found.   ASSESSMENT & PLAN:  Carcinoma of overlapping sites of left breast in female, estrogen receptor positive ([place]) #BREAST CANCER- Stage I breast cancer ER/PR positive on inhibitor Oncotype low risk; status post 5 years of aromatase inhibitor finished in 2016. Currently on surveillance. Clinically no evidence of recurrence. Last mammogram oct 2017; will order one today.  # Labs within normal limits. Discussed with patient that if she wished she could at this point be followed by primary care vs oncology yearly as she is now 8 years from her surgery. Patient opted to be followed by Dr. Doy Hutching moving forward. We will schedule mammogram for patient this year and then patient will return to primary care yearly for annual breast exams, lab testing, and mammograms. Should concerns arise, we are always available for consultation.    Orders Placed This Encounter  Procedures  . MM SCREENING BREAST TOMO BILATERAL    Standing Status:   Future    Standing Expiration Date:   10/31/2018    Order Specific Question:   Reason for Exam (SYMPTOM  OR DIAGNOSIS REQUIRED)    Answer:   Screening Mammogram s/p left lumpectomy 2010    Order Specific Question:   Preferred imaging location?    Answer:   [facility]  . US Breast Limited Uni Left Inc Axilla    Standing Status:   Future    Standing Expiration Date:   10/31/2018    Order Specific Question:   Reason for Exam (SYMPTOM  OR DIAGNOSIS REQUIRED)    Answer:   Screening mammogram s/p left lumpectomy 2010    Order Specific Question:   Preferred imaging location?    Answer:   [facility]  . US Breast Limited Uni Right Inc Axilla    Standing Status:   Future    Standing Expiration Date:   10/31/2018    Order Specific Question:   Reason for Exam (SYMPTOM  OR DIAGNOSIS REQUIRED)    Answer:   screening mammogram s/p left lumpectomy 2010    Order Specific Question:   Preferred imaging location?    Answer:   [facility]  . CA 27.29    Standing Status:   Future    Standing Expiration Date:   08/31/2018   All questions were answered. The patient knows to call the clinic with any problems, questions or concerns.   Takota Cahalan G. Zenia Resides, DNP AGNP-C 08/31/17 10:23 AM    Verlon Au, NP 08/31/2017 10:33 AM

## 2017-08-31 NOTE — Assessment & Plan Note (Signed)
#  BREAST CANCER- Stage I breast cancer ER/PR positive on inhibitor Oncotype low risk; status post 5 years of aromatase inhibitor finished in 2016. Currently on surveillance. Clinically no evidence of recurrence. Last mammogram oct 2017; will order one today.  # Labs within normal limits. Discussed with patient that if she wished she could at this point be followed by primary care vs oncology yearly as she is now 8 years from her surgery. Patient opted to be followed by Dr. Doy Hutching moving forward. We will schedule mammogram for patient this year and then patient will return to primary care yearly for annual breast exams, lab testing, and mammograms. Should concerns arise, we are always available for consultation.

## 2017-09-26 ENCOUNTER — Ambulatory Visit
Admission: RE | Admit: 2017-09-26 | Discharge: 2017-09-26 | Disposition: A | Discharge status: Home / Self Care | Payer: PPO | Source: Ambulatory Visit | Attending: Nurse Practitioner | Admitting: Nurse Practitioner

## 2017-09-26 DIAGNOSIS — Z170 Estrogen receptor positive status [ER+]: Secondary | ICD-10-CM

## 2017-09-26 DIAGNOSIS — Z1231 Encounter for screening mammogram for malignant neoplasm of breast: Secondary | ICD-10-CM | POA: Insufficient documentation

## 2017-09-26 DIAGNOSIS — C50812 Malignant neoplasm of overlapping sites of left female breast: Secondary | ICD-10-CM

## 2017-09-26 HISTORY — DX: Personal history of irradiation: Z92.3

## 2017-10-23 DIAGNOSIS — H353132 Nonexudative age-related macular degeneration, bilateral, intermediate dry stage: Secondary | ICD-10-CM | POA: Diagnosis not present

## 2017-11-23 DIAGNOSIS — Z79899 Other long term (current) drug therapy: Secondary | ICD-10-CM | POA: Diagnosis not present

## 2017-11-23 DIAGNOSIS — E7800 Pure hypercholesterolemia, unspecified: Secondary | ICD-10-CM | POA: Diagnosis not present

## 2017-11-23 DIAGNOSIS — I10 Essential (primary) hypertension: Secondary | ICD-10-CM | POA: Diagnosis not present

## 2017-11-30 DIAGNOSIS — C50919 Malignant neoplasm of unspecified site of unspecified female breast: Secondary | ICD-10-CM | POA: Diagnosis not present

## 2017-11-30 DIAGNOSIS — Z23 Encounter for immunization: Secondary | ICD-10-CM | POA: Diagnosis not present

## 2017-11-30 DIAGNOSIS — E559 Vitamin D deficiency, unspecified: Secondary | ICD-10-CM | POA: Diagnosis not present

## 2017-11-30 DIAGNOSIS — Z79899 Other long term (current) drug therapy: Secondary | ICD-10-CM | POA: Diagnosis not present

## 2017-11-30 DIAGNOSIS — Z0000 Encounter for general adult medical examination without abnormal findings: Secondary | ICD-10-CM | POA: Diagnosis not present

## 2017-11-30 DIAGNOSIS — D649 Anemia, unspecified: Secondary | ICD-10-CM | POA: Diagnosis not present

## 2017-11-30 DIAGNOSIS — I10 Essential (primary) hypertension: Secondary | ICD-10-CM | POA: Diagnosis not present

## 2017-11-30 DIAGNOSIS — E7800 Pure hypercholesterolemia, unspecified: Secondary | ICD-10-CM | POA: Diagnosis not present

## 2018-05-01 DIAGNOSIS — Z9841 Cataract extraction status, right eye: Secondary | ICD-10-CM | POA: Diagnosis not present

## 2018-05-01 DIAGNOSIS — Z9842 Cataract extraction status, left eye: Secondary | ICD-10-CM | POA: Diagnosis not present

## 2018-05-01 DIAGNOSIS — H353132 Nonexudative age-related macular degeneration, bilateral, intermediate dry stage: Secondary | ICD-10-CM | POA: Diagnosis not present

## 2018-05-01 DIAGNOSIS — H52213 Irregular astigmatism, bilateral: Secondary | ICD-10-CM | POA: Diagnosis not present

## 2018-05-24 DIAGNOSIS — E7800 Pure hypercholesterolemia, unspecified: Secondary | ICD-10-CM | POA: Diagnosis not present

## 2018-05-24 DIAGNOSIS — Z79899 Other long term (current) drug therapy: Secondary | ICD-10-CM | POA: Diagnosis not present

## 2018-05-24 DIAGNOSIS — E559 Vitamin D deficiency, unspecified: Secondary | ICD-10-CM | POA: Diagnosis not present

## 2018-05-24 DIAGNOSIS — I10 Essential (primary) hypertension: Secondary | ICD-10-CM | POA: Diagnosis not present

## 2018-05-31 DIAGNOSIS — I10 Essential (primary) hypertension: Secondary | ICD-10-CM | POA: Diagnosis not present

## 2018-05-31 DIAGNOSIS — Z0000 Encounter for general adult medical examination without abnormal findings: Secondary | ICD-10-CM | POA: Diagnosis not present

## 2018-05-31 DIAGNOSIS — Z79899 Other long term (current) drug therapy: Secondary | ICD-10-CM | POA: Diagnosis not present

## 2018-05-31 DIAGNOSIS — Z1239 Encounter for other screening for malignant neoplasm of breast: Secondary | ICD-10-CM | POA: Diagnosis not present

## 2018-05-31 DIAGNOSIS — E7800 Pure hypercholesterolemia, unspecified: Secondary | ICD-10-CM | POA: Diagnosis not present

## 2018-05-31 DIAGNOSIS — E559 Vitamin D deficiency, unspecified: Secondary | ICD-10-CM | POA: Diagnosis not present

## 2018-07-10 ENCOUNTER — Other Ambulatory Visit: Payer: Self-pay | Admitting: Internal Medicine

## 2018-07-10 DIAGNOSIS — Z1231 Encounter for screening mammogram for malignant neoplasm of breast: Secondary | ICD-10-CM

## 2018-10-09 ENCOUNTER — Ambulatory Visit
Admission: RE | Admit: 2018-10-09 | Discharge: 2018-10-09 | Disposition: A | Discharge status: Home / Self Care | Payer: PPO | Source: Ambulatory Visit | Attending: Internal Medicine | Admitting: Internal Medicine

## 2018-10-09 DIAGNOSIS — Z1231 Encounter for screening mammogram for malignant neoplasm of breast: Secondary | ICD-10-CM | POA: Diagnosis not present

## 2018-10-16 DIAGNOSIS — J069 Acute upper respiratory infection, unspecified: Secondary | ICD-10-CM | POA: Diagnosis not present

## 2018-11-13 DIAGNOSIS — H353132 Nonexudative age-related macular degeneration, bilateral, intermediate dry stage: Secondary | ICD-10-CM | POA: Diagnosis not present

## 2018-11-13 DIAGNOSIS — H40013 Open angle with borderline findings, low risk, bilateral: Secondary | ICD-10-CM | POA: Diagnosis not present

## 2018-11-29 DIAGNOSIS — E7800 Pure hypercholesterolemia, unspecified: Secondary | ICD-10-CM | POA: Diagnosis not present

## 2018-11-29 DIAGNOSIS — I10 Essential (primary) hypertension: Secondary | ICD-10-CM | POA: Diagnosis not present

## 2018-11-29 DIAGNOSIS — Z79899 Other long term (current) drug therapy: Secondary | ICD-10-CM | POA: Diagnosis not present

## 2018-12-06 DIAGNOSIS — E559 Vitamin D deficiency, unspecified: Secondary | ICD-10-CM | POA: Diagnosis not present

## 2018-12-06 DIAGNOSIS — Z0000 Encounter for general adult medical examination without abnormal findings: Secondary | ICD-10-CM | POA: Diagnosis not present

## 2018-12-06 DIAGNOSIS — D649 Anemia, unspecified: Secondary | ICD-10-CM | POA: Diagnosis not present

## 2018-12-06 DIAGNOSIS — I10 Essential (primary) hypertension: Secondary | ICD-10-CM | POA: Diagnosis not present

## 2018-12-06 DIAGNOSIS — Z79899 Other long term (current) drug therapy: Secondary | ICD-10-CM | POA: Diagnosis not present

## 2018-12-06 DIAGNOSIS — E7800 Pure hypercholesterolemia, unspecified: Secondary | ICD-10-CM | POA: Diagnosis not present

## 2019-05-13 DIAGNOSIS — Z9841 Cataract extraction status, right eye: Secondary | ICD-10-CM | POA: Diagnosis not present

## 2019-05-13 DIAGNOSIS — Z9842 Cataract extraction status, left eye: Secondary | ICD-10-CM | POA: Diagnosis not present

## 2019-05-13 DIAGNOSIS — H353132 Nonexudative age-related macular degeneration, bilateral, intermediate dry stage: Secondary | ICD-10-CM | POA: Diagnosis not present

## 2019-05-13 DIAGNOSIS — H52213 Irregular astigmatism, bilateral: Secondary | ICD-10-CM | POA: Diagnosis not present

## 2019-05-13 DIAGNOSIS — H40013 Open angle with borderline findings, low risk, bilateral: Secondary | ICD-10-CM | POA: Diagnosis not present

## 2019-06-11 DIAGNOSIS — E7800 Pure hypercholesterolemia, unspecified: Secondary | ICD-10-CM | POA: Diagnosis not present

## 2019-06-11 DIAGNOSIS — Z79899 Other long term (current) drug therapy: Secondary | ICD-10-CM | POA: Diagnosis not present

## 2019-06-11 DIAGNOSIS — I10 Essential (primary) hypertension: Secondary | ICD-10-CM | POA: Diagnosis not present

## 2019-06-11 DIAGNOSIS — E559 Vitamin D deficiency, unspecified: Secondary | ICD-10-CM | POA: Diagnosis not present

## 2019-06-18 DIAGNOSIS — E559 Vitamin D deficiency, unspecified: Secondary | ICD-10-CM | POA: Diagnosis not present

## 2019-06-18 DIAGNOSIS — Z79899 Other long term (current) drug therapy: Secondary | ICD-10-CM | POA: Diagnosis not present

## 2019-06-18 DIAGNOSIS — I10 Essential (primary) hypertension: Secondary | ICD-10-CM | POA: Diagnosis not present

## 2019-06-18 DIAGNOSIS — Z1239 Encounter for other screening for malignant neoplasm of breast: Secondary | ICD-10-CM | POA: Diagnosis not present

## 2019-06-18 DIAGNOSIS — E7800 Pure hypercholesterolemia, unspecified: Secondary | ICD-10-CM | POA: Diagnosis not present

## 2019-09-19 ENCOUNTER — Other Ambulatory Visit: Payer: Self-pay | Admitting: Internal Medicine

## 2019-09-19 DIAGNOSIS — Z1231 Encounter for screening mammogram for malignant neoplasm of breast: Secondary | ICD-10-CM

## 2019-10-14 ENCOUNTER — Ambulatory Visit
Admission: RE | Admit: 2019-10-14 | Discharge: 2019-10-14 | Disposition: A | Discharge status: Home / Self Care | Payer: PPO | Source: Ambulatory Visit | Attending: Internal Medicine | Admitting: Internal Medicine

## 2019-10-14 DIAGNOSIS — Z1231 Encounter for screening mammogram for malignant neoplasm of breast: Secondary | ICD-10-CM | POA: Insufficient documentation

## 2019-12-02 DIAGNOSIS — H40013 Open angle with borderline findings, low risk, bilateral: Secondary | ICD-10-CM | POA: Diagnosis not present

## 2019-12-02 DIAGNOSIS — H353132 Nonexudative age-related macular degeneration, bilateral, intermediate dry stage: Secondary | ICD-10-CM | POA: Diagnosis not present

## 2019-12-19 DIAGNOSIS — Z79899 Other long term (current) drug therapy: Secondary | ICD-10-CM | POA: Diagnosis not present

## 2019-12-19 DIAGNOSIS — E7800 Pure hypercholesterolemia, unspecified: Secondary | ICD-10-CM | POA: Diagnosis not present

## 2019-12-19 DIAGNOSIS — I10 Essential (primary) hypertension: Secondary | ICD-10-CM | POA: Diagnosis not present

## 2020-01-17 DIAGNOSIS — E559 Vitamin D deficiency, unspecified: Secondary | ICD-10-CM | POA: Diagnosis not present

## 2020-01-17 DIAGNOSIS — Z13820 Encounter for screening for osteoporosis: Secondary | ICD-10-CM | POA: Diagnosis not present

## 2020-01-17 DIAGNOSIS — Z79899 Other long term (current) drug therapy: Secondary | ICD-10-CM | POA: Diagnosis not present

## 2020-01-17 DIAGNOSIS — I10 Essential (primary) hypertension: Secondary | ICD-10-CM | POA: Diagnosis not present

## 2020-01-17 DIAGNOSIS — E7800 Pure hypercholesterolemia, unspecified: Secondary | ICD-10-CM | POA: Diagnosis not present

## 2020-01-17 DIAGNOSIS — Z0000 Encounter for general adult medical examination without abnormal findings: Secondary | ICD-10-CM | POA: Diagnosis not present

## 2020-02-20 DIAGNOSIS — M810 Age-related osteoporosis without current pathological fracture: Secondary | ICD-10-CM | POA: Diagnosis not present

## 2020-03-10 DIAGNOSIS — Z79899 Other long term (current) drug therapy: Secondary | ICD-10-CM | POA: Diagnosis not present

## 2020-05-05 DIAGNOSIS — H353221 Exudative age-related macular degeneration, left eye, with active choroidal neovascularization: Secondary | ICD-10-CM | POA: Diagnosis not present

## 2020-05-06 DIAGNOSIS — H3581 Retinal edema: Secondary | ICD-10-CM | POA: Diagnosis not present

## 2020-05-06 DIAGNOSIS — H43813 Vitreous degeneration, bilateral: Secondary | ICD-10-CM | POA: Diagnosis not present

## 2020-05-06 DIAGNOSIS — H353113 Nonexudative age-related macular degeneration, right eye, advanced atrophic without subfoveal involvement: Secondary | ICD-10-CM | POA: Diagnosis not present

## 2020-05-06 DIAGNOSIS — H353221 Exudative age-related macular degeneration, left eye, with active choroidal neovascularization: Secondary | ICD-10-CM | POA: Diagnosis not present

## 2020-05-13 DIAGNOSIS — H353221 Exudative age-related macular degeneration, left eye, with active choroidal neovascularization: Secondary | ICD-10-CM | POA: Diagnosis not present

## 2020-06-12 DIAGNOSIS — H43813 Vitreous degeneration, bilateral: Secondary | ICD-10-CM | POA: Diagnosis not present

## 2020-06-12 DIAGNOSIS — H353221 Exudative age-related macular degeneration, left eye, with active choroidal neovascularization: Secondary | ICD-10-CM | POA: Diagnosis not present

## 2020-06-12 DIAGNOSIS — H353114 Nonexudative age-related macular degeneration, right eye, advanced atrophic with subfoveal involvement: Secondary | ICD-10-CM | POA: Diagnosis not present

## 2020-06-12 DIAGNOSIS — H4321 Crystalline deposits in vitreous body, right eye: Secondary | ICD-10-CM | POA: Diagnosis not present

## 2020-07-21 DIAGNOSIS — H43813 Vitreous degeneration, bilateral: Secondary | ICD-10-CM | POA: Diagnosis not present

## 2020-07-21 DIAGNOSIS — H353114 Nonexudative age-related macular degeneration, right eye, advanced atrophic with subfoveal involvement: Secondary | ICD-10-CM | POA: Diagnosis not present

## 2020-07-21 DIAGNOSIS — H4321 Crystalline deposits in vitreous body, right eye: Secondary | ICD-10-CM | POA: Diagnosis not present

## 2020-07-21 DIAGNOSIS — H353221 Exudative age-related macular degeneration, left eye, with active choroidal neovascularization: Secondary | ICD-10-CM | POA: Diagnosis not present

## 2020-07-27 DIAGNOSIS — E7800 Pure hypercholesterolemia, unspecified: Secondary | ICD-10-CM | POA: Diagnosis not present

## 2020-07-27 DIAGNOSIS — Z23 Encounter for immunization: Secondary | ICD-10-CM | POA: Diagnosis not present

## 2020-07-27 DIAGNOSIS — Z1231 Encounter for screening mammogram for malignant neoplasm of breast: Secondary | ICD-10-CM | POA: Diagnosis not present

## 2020-07-27 DIAGNOSIS — E559 Vitamin D deficiency, unspecified: Secondary | ICD-10-CM | POA: Diagnosis not present

## 2020-07-27 DIAGNOSIS — R7309 Other abnormal glucose: Secondary | ICD-10-CM | POA: Diagnosis not present

## 2020-07-27 DIAGNOSIS — Z79899 Other long term (current) drug therapy: Secondary | ICD-10-CM | POA: Diagnosis not present

## 2020-07-27 DIAGNOSIS — I10 Essential (primary) hypertension: Secondary | ICD-10-CM | POA: Diagnosis not present

## 2020-07-28 ENCOUNTER — Other Ambulatory Visit: Payer: Self-pay | Admitting: Internal Medicine

## 2020-07-28 DIAGNOSIS — Z1231 Encounter for screening mammogram for malignant neoplasm of breast: Secondary | ICD-10-CM

## 2020-09-01 DIAGNOSIS — H43813 Vitreous degeneration, bilateral: Secondary | ICD-10-CM | POA: Diagnosis not present

## 2020-09-01 DIAGNOSIS — H353221 Exudative age-related macular degeneration, left eye, with active choroidal neovascularization: Secondary | ICD-10-CM | POA: Diagnosis not present

## 2020-09-01 DIAGNOSIS — H4321 Crystalline deposits in vitreous body, right eye: Secondary | ICD-10-CM | POA: Diagnosis not present

## 2020-09-01 DIAGNOSIS — H353114 Nonexudative age-related macular degeneration, right eye, advanced atrophic with subfoveal involvement: Secondary | ICD-10-CM | POA: Diagnosis not present

## 2020-10-23 DIAGNOSIS — H353221 Exudative age-related macular degeneration, left eye, with active choroidal neovascularization: Secondary | ICD-10-CM | POA: Diagnosis not present

## 2020-10-23 DIAGNOSIS — H353114 Nonexudative age-related macular degeneration, right eye, advanced atrophic with subfoveal involvement: Secondary | ICD-10-CM | POA: Diagnosis not present

## 2020-10-23 DIAGNOSIS — H43813 Vitreous degeneration, bilateral: Secondary | ICD-10-CM | POA: Diagnosis not present

## 2020-10-23 DIAGNOSIS — H4321 Crystalline deposits in vitreous body, right eye: Secondary | ICD-10-CM | POA: Diagnosis not present

## 2020-11-09 ENCOUNTER — Ambulatory Visit
Admission: RE | Admit: 2020-11-09 | Discharge: 2020-11-09 | Disposition: A | Discharge status: Home / Self Care | Payer: PPO | Source: Ambulatory Visit | Attending: Internal Medicine | Admitting: Internal Medicine

## 2020-11-09 ENCOUNTER — Other Ambulatory Visit: Payer: Self-pay

## 2020-11-09 DIAGNOSIS — Z1231 Encounter for screening mammogram for malignant neoplasm of breast: Secondary | ICD-10-CM | POA: Diagnosis not present

## 2020-12-29 DIAGNOSIS — H353221 Exudative age-related macular degeneration, left eye, with active choroidal neovascularization: Secondary | ICD-10-CM | POA: Diagnosis not present

## 2020-12-29 DIAGNOSIS — H353114 Nonexudative age-related macular degeneration, right eye, advanced atrophic with subfoveal involvement: Secondary | ICD-10-CM | POA: Diagnosis not present

## 2020-12-29 DIAGNOSIS — H43813 Vitreous degeneration, bilateral: Secondary | ICD-10-CM | POA: Diagnosis not present

## 2020-12-29 DIAGNOSIS — H3581 Retinal edema: Secondary | ICD-10-CM | POA: Diagnosis not present

## 2021-01-21 DIAGNOSIS — Z0000 Encounter for general adult medical examination without abnormal findings: Secondary | ICD-10-CM | POA: Diagnosis not present

## 2021-01-21 DIAGNOSIS — R7309 Other abnormal glucose: Secondary | ICD-10-CM | POA: Diagnosis not present

## 2021-01-21 DIAGNOSIS — I10 Essential (primary) hypertension: Secondary | ICD-10-CM | POA: Diagnosis not present

## 2021-01-21 DIAGNOSIS — E559 Vitamin D deficiency, unspecified: Secondary | ICD-10-CM | POA: Diagnosis not present

## 2021-01-21 DIAGNOSIS — Z79899 Other long term (current) drug therapy: Secondary | ICD-10-CM | POA: Diagnosis not present

## 2021-01-21 DIAGNOSIS — E782 Mixed hyperlipidemia: Secondary | ICD-10-CM | POA: Diagnosis not present

## 2021-02-16 DIAGNOSIS — Z23 Encounter for immunization: Secondary | ICD-10-CM | POA: Diagnosis not present

## 2021-03-02 DIAGNOSIS — H353221 Exudative age-related macular degeneration, left eye, with active choroidal neovascularization: Secondary | ICD-10-CM | POA: Diagnosis not present

## 2021-03-02 DIAGNOSIS — H3581 Retinal edema: Secondary | ICD-10-CM | POA: Diagnosis not present

## 2021-03-02 DIAGNOSIS — H43813 Vitreous degeneration, bilateral: Secondary | ICD-10-CM | POA: Diagnosis not present

## 2021-03-02 DIAGNOSIS — H353114 Nonexudative age-related macular degeneration, right eye, advanced atrophic with subfoveal involvement: Secondary | ICD-10-CM | POA: Diagnosis not present

## 2021-04-02 ENCOUNTER — Ambulatory Visit
Admission: EM | Admit: 2021-04-02 | Discharge: 2021-04-02 | Disposition: A | Discharge status: Home / Self Care | Payer: PPO

## 2021-04-02 ENCOUNTER — Other Ambulatory Visit: Payer: Self-pay

## 2021-04-02 DIAGNOSIS — U071 COVID-19: Secondary | ICD-10-CM

## 2021-04-02 NOTE — ED Provider Notes (Signed)
Tiffany Skinner    CSN: [id number] Arrival date & time: 04/02/21  1107      History   Chief Complaint Chief Complaint  Patient presents with  . Headache    Tested positive for covid      HPI Tiffany Skinner is a 80 y.o. female.   Patient presents with mild headache and sore throat x 4-5 days.  Her symptoms are improving.  She had a positive at-home COVID test 4 days ago and again this morning.  She has treated her symptoms at home with Sudafed and Aleve.  She denies fever, chills, rash, cough, shortness of breath, vomiting, diarrhea, or other symptoms.  Her husband was recently COVID positive also.  Her medical history includes breast cancer, hypertension, anemia, arthritis, diverticulitis.  The history is provided by the patient and medical records.    Past Medical History:  Diagnosis Date  . Allergic state   . Anemia   . Arthritis   . Breast cancer ([place]) 2010   Left  . History of colonic diverticulitis   . Hx of colonic polyp   . Hyperlipidemia   . Hypertension   . Personal history of radiation therapy 2010   mammosite    Patient Active Problem List   Diagnosis Date Noted  . Carcinoma of overlapping sites of left breast in female, estrogen receptor positive ([place]) 08/22/2016  . Cancer ([place]) 08/15/2016    Past Surgical History:  Procedure Laterality Date  . BREAST BIOPSY Left 2010   stage 1 carcinoma  . BREAST LUMPECTOMY Left 2010   stage 1  carcinoma with SN bx  . COLONOSCOPY  04/24/2012  . COLONOSCOPY WITH PROPOFOL N/A 08/21/2017   Procedure: COLONOSCOPY WITH PROPOFOL;  Surgeon: Manya Silvas, MD;  Location[facility] ENDOSCOPY;  Service: Endoscopy;  Laterality: N/A;  . TUBAL LIGATION      OB History   No obstetric history on file.      Home Medications    Prior to Admission medications   Medication Sig Start Date End Date Taking? Authorizing Provider  aspirin EC 81 MG tablet Take by mouth.    [provider]  atorvastatin  (LIPITOR) 20 MG tablet Take 1 tablet by mouth once daily 06/22/15   [provider]  calcium carbonate (OS-CAL - DOSED IN MG OF ELEMENTAL CALCIUM) 1250 (500 CA) MG tablet Take by mouth.    [provider]  cholecalciferol (VITAMIN D) 400 units TABS tablet Take 1,000 Units by mouth daily.    [provider]  cyanocobalamin 1000 MCG tablet Take 1,000 mcg by mouth daily.    [provider]  losartan-hydrochlorothiazide Konrad Penta) 100-12.5 MG per tablet Take 1 tablet by mouth once daily 06/22/15   [provider]  Multiple Vitamin (MULTIVITAMIN) capsule Take by mouth.    [provider]  Multiple Vitamins-Minerals (PRESERVISION AREDS 2 PO) Take by mouth.    [provider]  PNEUMOVAX 23 25 MCG/0.5ML injection  10/06/20   [provider]  polyethylene glycol powder (GLYCOLAX/MIRALAX) powder Take 1 Container by mouth once.    [provider]  tobramycin (TOBREX) 0.3 % ophthalmic solution PLEASE SEE ATTACHED FOR DETAILED DIRECTIONS 12/26/20   [provider]    Family History Family History  Problem Relation Age of Onset  . Stroke Mother   . Stroke Father   . Breast cancer Neg Hx     Social History Social History   Tobacco Use  . Smoking status: Never Smoker  .  Smokeless tobacco: Never Used  Substance Use Topics  . Alcohol use: Yes    Alcohol/week: 0.0 standard drinks  . Drug use: No     Allergies   Patient has no known allergies.   Review of Systems Review of Systems  Constitutional: Negative for chills and fever.  HENT: Positive for sore throat. Negative for ear pain.   Respiratory: Negative for cough and shortness of breath.   Cardiovascular: Negative for chest pain and palpitations.  Gastrointestinal: Negative for abdominal pain, diarrhea and vomiting.  Skin: Negative for color change and rash.  Neurological: Positive for headaches. Negative for weakness and numbness.  All other systems  reviewed and are negative.    Physical Exam Triage Vital Signs ED Triage Vitals  Enc Vitals Group     BP      Pulse      Resp      Temp      Temp src      SpO2      Weight      Height      Head Circumference      Peak Flow      Pain Score      Pain Loc      Pain Edu?      Excl. in [place]?    No data found.  Updated Vital Signs BP (S) (!) 165/88 (BP Location: Right Arm)   Pulse 87   Temp 99 F (37.2 C) (Oral)   Resp 16   SpO2 96%   Visual Acuity Right Eye Distance:   Left Eye Distance:   Bilateral Distance:    Right Eye Near:   Left Eye Near:    Bilateral Near:     Physical Exam Vitals and nursing note reviewed.  Constitutional:      General: She is not in acute distress.    Appearance: She is well-developed. She is not ill-appearing.  HENT:     Head: Normocephalic and atraumatic.     Right Ear: Tympanic membrane normal.     Left Ear: Tympanic membrane normal.     Nose: Nose normal.     Mouth/Throat:     Mouth: Mucous membranes are moist.     Pharynx: Oropharynx is clear.  Eyes:     Conjunctiva/sclera: Conjunctivae normal.  Cardiovascular:     Rate and Rhythm: Normal rate and regular rhythm.     Heart sounds: Normal heart sounds.  Pulmonary:     Effort: Pulmonary effort is normal. No respiratory distress.     Breath sounds: Normal breath sounds. No wheezing, rhonchi or rales.  Abdominal:     Palpations: Abdomen is soft.     Tenderness: There is no abdominal tenderness.  Musculoskeletal:     Cervical back: Neck supple.  Skin:    General: Skin is warm and dry.  Neurological:     General: No focal deficit present.     Mental Status: She is alert and oriented to person, place, and time.     Gait: Gait normal.  Psychiatric:        Mood and Affect: Mood normal.        Behavior: Behavior normal.      UC Treatments / Results  Labs (all labs ordered are listed, but only abnormal results are displayed) Labs Reviewed  NOVEL CORONAVIRUS, NAA     EKG   Radiology No results found.  Procedures Procedures (including critical care time)  Medications Ordered in UC Medications - No data  to display  Initial Impression / Assessment and Plan / UC Course  I have reviewed the triage vital signs and the nursing notes.  Pertinent labs & imaging results that were available during my care of the patient were reviewed by me and considered in my medical decision making (see chart for details).   COVID-19.  Patient is on day 4 or 5 of her symptoms.  Her lungs are clear and O2 sat is 96%.  Her symptoms are mild and improving.  Discussed COVID treatments with patient, including Paxlovid and molnupiravir; discussed side effects and window for treatment.  Patient states she does not want the treatment for COVID due to the potential side effects.  Discussed ongoing symptomatic treatment.  ED precautions discussed.  Instructed her to follow-up with her PCP as needed.  She agrees to plan of care.   Final Clinical Impressions(s) / UC Diagnoses   Final diagnoses:  [id number]     Discharge Instructions     Continue symptomatic treatment at home.    Follow up with your primary care provider if your symptoms are not improving.       ED Prescriptions    None     PDMP not reviewed this encounter.   Sharion Balloon, NP 04/02/21 1233

## 2021-04-02 NOTE — Discharge Instructions (Addendum)
Continue symptomatic treatment at home.    Follow up with your primary care provider if your symptoms are not improving.

## 2021-04-02 NOTE — ED Triage Notes (Signed)
Patient presents to Urgent Care with complaints of headache, sore throat x 5 days. Here for possible covid treatment tested positive for covid x 4 days ago. Treating symptoms with sudafed and aleve.   Denies fever.

## 2021-04-30 DIAGNOSIS — H353221 Exudative age-related macular degeneration, left eye, with active choroidal neovascularization: Secondary | ICD-10-CM | POA: Diagnosis not present

## 2021-04-30 DIAGNOSIS — H43813 Vitreous degeneration, bilateral: Secondary | ICD-10-CM | POA: Diagnosis not present

## 2021-04-30 DIAGNOSIS — H353114 Nonexudative age-related macular degeneration, right eye, advanced atrophic with subfoveal involvement: Secondary | ICD-10-CM | POA: Diagnosis not present

## 2021-06-11 DIAGNOSIS — H353112 Nonexudative age-related macular degeneration, right eye, intermediate dry stage: Secondary | ICD-10-CM | POA: Diagnosis not present

## 2021-06-25 DIAGNOSIS — H353113 Nonexudative age-related macular degeneration, right eye, advanced atrophic without subfoveal involvement: Secondary | ICD-10-CM | POA: Diagnosis not present

## 2021-06-25 DIAGNOSIS — H353221 Exudative age-related macular degeneration, left eye, with active choroidal neovascularization: Secondary | ICD-10-CM | POA: Diagnosis not present

## 2021-06-25 DIAGNOSIS — H3581 Retinal edema: Secondary | ICD-10-CM | POA: Diagnosis not present

## 2021-06-25 DIAGNOSIS — H43813 Vitreous degeneration, bilateral: Secondary | ICD-10-CM | POA: Diagnosis not present

## 2021-07-11 DIAGNOSIS — H353112 Nonexudative age-related macular degeneration, right eye, intermediate dry stage: Secondary | ICD-10-CM | POA: Diagnosis not present

## 2021-07-26 DIAGNOSIS — E559 Vitamin D deficiency, unspecified: Secondary | ICD-10-CM | POA: Diagnosis not present

## 2021-07-26 DIAGNOSIS — R7309 Other abnormal glucose: Secondary | ICD-10-CM | POA: Diagnosis not present

## 2021-07-26 DIAGNOSIS — Z0000 Encounter for general adult medical examination without abnormal findings: Secondary | ICD-10-CM | POA: Diagnosis not present

## 2021-07-26 DIAGNOSIS — Z79899 Other long term (current) drug therapy: Secondary | ICD-10-CM | POA: Diagnosis not present

## 2021-07-26 DIAGNOSIS — E7800 Pure hypercholesterolemia, unspecified: Secondary | ICD-10-CM | POA: Diagnosis not present

## 2021-07-26 DIAGNOSIS — E782 Mixed hyperlipidemia: Secondary | ICD-10-CM | POA: Diagnosis not present

## 2021-07-26 DIAGNOSIS — I10 Essential (primary) hypertension: Secondary | ICD-10-CM | POA: Diagnosis not present

## 2021-07-26 DIAGNOSIS — Z1231 Encounter for screening mammogram for malignant neoplasm of breast: Secondary | ICD-10-CM | POA: Diagnosis not present

## 2021-08-10 DIAGNOSIS — H353112 Nonexudative age-related macular degeneration, right eye, intermediate dry stage: Secondary | ICD-10-CM | POA: Diagnosis not present

## 2021-08-20 DIAGNOSIS — H4321 Crystalline deposits in vitreous body, right eye: Secondary | ICD-10-CM | POA: Diagnosis not present

## 2021-08-20 DIAGNOSIS — H353221 Exudative age-related macular degeneration, left eye, with active choroidal neovascularization: Secondary | ICD-10-CM | POA: Diagnosis not present

## 2021-08-20 DIAGNOSIS — H353113 Nonexudative age-related macular degeneration, right eye, advanced atrophic without subfoveal involvement: Secondary | ICD-10-CM | POA: Diagnosis not present

## 2021-08-20 DIAGNOSIS — H43813 Vitreous degeneration, bilateral: Secondary | ICD-10-CM | POA: Diagnosis not present

## 2021-09-09 DIAGNOSIS — H353112 Nonexudative age-related macular degeneration, right eye, intermediate dry stage: Secondary | ICD-10-CM | POA: Diagnosis not present

## 2021-09-09 DIAGNOSIS — R710 Precipitous drop in hematocrit: Secondary | ICD-10-CM | POA: Diagnosis not present

## 2021-10-09 DIAGNOSIS — H353112 Nonexudative age-related macular degeneration, right eye, intermediate dry stage: Secondary | ICD-10-CM | POA: Diagnosis not present

## 2021-10-15 DIAGNOSIS — H4321 Crystalline deposits in vitreous body, right eye: Secondary | ICD-10-CM | POA: Diagnosis not present

## 2021-10-15 DIAGNOSIS — H353221 Exudative age-related macular degeneration, left eye, with active choroidal neovascularization: Secondary | ICD-10-CM | POA: Diagnosis not present

## 2021-10-15 DIAGNOSIS — H43813 Vitreous degeneration, bilateral: Secondary | ICD-10-CM | POA: Diagnosis not present

## 2021-10-15 DIAGNOSIS — H353113 Nonexudative age-related macular degeneration, right eye, advanced atrophic without subfoveal involvement: Secondary | ICD-10-CM | POA: Diagnosis not present

## 2021-10-18 ENCOUNTER — Other Ambulatory Visit: Payer: Self-pay | Admitting: Internal Medicine

## 2021-10-18 DIAGNOSIS — Z1231 Encounter for screening mammogram for malignant neoplasm of breast: Secondary | ICD-10-CM

## 2021-11-08 DIAGNOSIS — H353112 Nonexudative age-related macular degeneration, right eye, intermediate dry stage: Secondary | ICD-10-CM | POA: Diagnosis not present

## 2021-11-16 DIAGNOSIS — H353221 Exudative age-related macular degeneration, left eye, with active choroidal neovascularization: Secondary | ICD-10-CM | POA: Diagnosis not present

## 2021-11-16 DIAGNOSIS — H43813 Vitreous degeneration, bilateral: Secondary | ICD-10-CM | POA: Diagnosis not present

## 2021-11-16 DIAGNOSIS — H353113 Nonexudative age-related macular degeneration, right eye, advanced atrophic without subfoveal involvement: Secondary | ICD-10-CM | POA: Diagnosis not present

## 2021-12-10 ENCOUNTER — Ambulatory Visit
Admission: RE | Admit: 2021-12-10 | Discharge: 2021-12-10 | Disposition: A | Discharge status: Home / Self Care | Payer: PPO | Source: Ambulatory Visit | Attending: Internal Medicine | Admitting: Internal Medicine

## 2021-12-10 ENCOUNTER — Other Ambulatory Visit: Payer: Self-pay

## 2021-12-10 DIAGNOSIS — Z1231 Encounter for screening mammogram for malignant neoplasm of breast: Secondary | ICD-10-CM | POA: Insufficient documentation

## 2021-12-14 DIAGNOSIS — H353221 Exudative age-related macular degeneration, left eye, with active choroidal neovascularization: Secondary | ICD-10-CM | POA: Diagnosis not present

## 2021-12-14 DIAGNOSIS — H43813 Vitreous degeneration, bilateral: Secondary | ICD-10-CM | POA: Diagnosis not present

## 2021-12-14 DIAGNOSIS — H4321 Crystalline deposits in vitreous body, right eye: Secondary | ICD-10-CM | POA: Diagnosis not present

## 2021-12-14 DIAGNOSIS — H353114 Nonexudative age-related macular degeneration, right eye, advanced atrophic with subfoveal involvement: Secondary | ICD-10-CM | POA: Diagnosis not present

## 2022-01-24 DIAGNOSIS — E559 Vitamin D deficiency, unspecified: Secondary | ICD-10-CM | POA: Diagnosis not present

## 2022-01-24 DIAGNOSIS — R7309 Other abnormal glucose: Secondary | ICD-10-CM | POA: Diagnosis not present

## 2022-01-24 DIAGNOSIS — I10 Essential (primary) hypertension: Secondary | ICD-10-CM | POA: Diagnosis not present

## 2022-01-24 DIAGNOSIS — Z0000 Encounter for general adult medical examination without abnormal findings: Secondary | ICD-10-CM | POA: Diagnosis not present

## 2022-01-24 DIAGNOSIS — Z79899 Other long term (current) drug therapy: Secondary | ICD-10-CM | POA: Diagnosis not present

## 2022-01-24 DIAGNOSIS — E782 Mixed hyperlipidemia: Secondary | ICD-10-CM | POA: Diagnosis not present

## 2022-02-22 DIAGNOSIS — I498 Other specified cardiac arrhythmias: Secondary | ICD-10-CM | POA: Diagnosis not present

## 2022-02-22 DIAGNOSIS — I499 Cardiac arrhythmia, unspecified: Secondary | ICD-10-CM | POA: Diagnosis not present

## 2022-02-22 DIAGNOSIS — I10 Essential (primary) hypertension: Secondary | ICD-10-CM | POA: Diagnosis not present

## 2022-02-25 DIAGNOSIS — H4321 Crystalline deposits in vitreous body, right eye: Secondary | ICD-10-CM | POA: Diagnosis not present

## 2022-02-25 DIAGNOSIS — H43813 Vitreous degeneration, bilateral: Secondary | ICD-10-CM | POA: Diagnosis not present

## 2022-02-25 DIAGNOSIS — H353113 Nonexudative age-related macular degeneration, right eye, advanced atrophic without subfoveal involvement: Secondary | ICD-10-CM | POA: Diagnosis not present

## 2022-02-25 DIAGNOSIS — H353221 Exudative age-related macular degeneration, left eye, with active choroidal neovascularization: Secondary | ICD-10-CM | POA: Diagnosis not present

## 2022-03-03 DIAGNOSIS — I498 Other specified cardiac arrhythmias: Secondary | ICD-10-CM | POA: Diagnosis not present

## 2022-04-15 DIAGNOSIS — H4321 Crystalline deposits in vitreous body, right eye: Secondary | ICD-10-CM | POA: Diagnosis not present

## 2022-04-15 DIAGNOSIS — H43813 Vitreous degeneration, bilateral: Secondary | ICD-10-CM | POA: Diagnosis not present

## 2022-04-15 DIAGNOSIS — H353113 Nonexudative age-related macular degeneration, right eye, advanced atrophic without subfoveal involvement: Secondary | ICD-10-CM | POA: Diagnosis not present

## 2022-04-15 DIAGNOSIS — H353221 Exudative age-related macular degeneration, left eye, with active choroidal neovascularization: Secondary | ICD-10-CM | POA: Diagnosis not present

## 2022-04-26 DIAGNOSIS — Z79899 Other long term (current) drug therapy: Secondary | ICD-10-CM | POA: Diagnosis not present

## 2022-04-26 DIAGNOSIS — I10 Essential (primary) hypertension: Secondary | ICD-10-CM | POA: Diagnosis not present

## 2022-04-26 DIAGNOSIS — R7309 Other abnormal glucose: Secondary | ICD-10-CM | POA: Diagnosis not present

## 2022-04-26 DIAGNOSIS — E7800 Pure hypercholesterolemia, unspecified: Secondary | ICD-10-CM | POA: Diagnosis not present

## 2022-04-26 DIAGNOSIS — E559 Vitamin D deficiency, unspecified: Secondary | ICD-10-CM | POA: Diagnosis not present

## 2022-06-01 DIAGNOSIS — H26492 Other secondary cataract, left eye: Secondary | ICD-10-CM | POA: Diagnosis not present

## 2022-06-01 DIAGNOSIS — H4321 Crystalline deposits in vitreous body, right eye: Secondary | ICD-10-CM | POA: Diagnosis not present

## 2022-06-01 DIAGNOSIS — H353231 Exudative age-related macular degeneration, bilateral, with active choroidal neovascularization: Secondary | ICD-10-CM | POA: Diagnosis not present

## 2022-06-01 DIAGNOSIS — H43813 Vitreous degeneration, bilateral: Secondary | ICD-10-CM | POA: Diagnosis not present

## 2022-07-20 DIAGNOSIS — H4321 Crystalline deposits in vitreous body, right eye: Secondary | ICD-10-CM | POA: Diagnosis not present

## 2022-07-20 DIAGNOSIS — H353231 Exudative age-related macular degeneration, bilateral, with active choroidal neovascularization: Secondary | ICD-10-CM | POA: Diagnosis not present

## 2022-07-20 DIAGNOSIS — H43813 Vitreous degeneration, bilateral: Secondary | ICD-10-CM | POA: Diagnosis not present

## 2022-07-20 DIAGNOSIS — H26492 Other secondary cataract, left eye: Secondary | ICD-10-CM | POA: Diagnosis not present

## 2022-07-28 DIAGNOSIS — E7800 Pure hypercholesterolemia, unspecified: Secondary | ICD-10-CM | POA: Diagnosis not present

## 2022-07-28 DIAGNOSIS — E559 Vitamin D deficiency, unspecified: Secondary | ICD-10-CM | POA: Diagnosis not present

## 2022-07-28 DIAGNOSIS — Z79899 Other long term (current) drug therapy: Secondary | ICD-10-CM | POA: Diagnosis not present

## 2022-07-28 DIAGNOSIS — I10 Essential (primary) hypertension: Secondary | ICD-10-CM | POA: Diagnosis not present

## 2022-07-28 DIAGNOSIS — R7309 Other abnormal glucose: Secondary | ICD-10-CM | POA: Diagnosis not present

## 2022-08-04 DIAGNOSIS — I10 Essential (primary) hypertension: Secondary | ICD-10-CM | POA: Diagnosis not present

## 2022-08-04 DIAGNOSIS — E782 Mixed hyperlipidemia: Secondary | ICD-10-CM | POA: Diagnosis not present

## 2022-08-04 DIAGNOSIS — E559 Vitamin D deficiency, unspecified: Secondary | ICD-10-CM | POA: Diagnosis not present

## 2022-08-24 DIAGNOSIS — H6123 Impacted cerumen, bilateral: Secondary | ICD-10-CM | POA: Diagnosis not present

## 2022-09-06 DIAGNOSIS — H26492 Other secondary cataract, left eye: Secondary | ICD-10-CM | POA: Diagnosis not present

## 2022-09-06 DIAGNOSIS — H353231 Exudative age-related macular degeneration, bilateral, with active choroidal neovascularization: Secondary | ICD-10-CM | POA: Diagnosis not present

## 2022-09-06 DIAGNOSIS — H4321 Crystalline deposits in vitreous body, right eye: Secondary | ICD-10-CM | POA: Diagnosis not present

## 2022-09-06 DIAGNOSIS — H43813 Vitreous degeneration, bilateral: Secondary | ICD-10-CM | POA: Diagnosis not present

## 2022-10-11 DIAGNOSIS — H353221 Exudative age-related macular degeneration, left eye, with active choroidal neovascularization: Secondary | ICD-10-CM | POA: Diagnosis not present

## 2022-11-11 DIAGNOSIS — H26492 Other secondary cataract, left eye: Secondary | ICD-10-CM | POA: Diagnosis not present

## 2022-11-11 DIAGNOSIS — H353231 Exudative age-related macular degeneration, bilateral, with active choroidal neovascularization: Secondary | ICD-10-CM | POA: Diagnosis not present

## 2022-11-11 DIAGNOSIS — H4321 Crystalline deposits in vitreous body, right eye: Secondary | ICD-10-CM | POA: Diagnosis not present

## 2022-11-11 DIAGNOSIS — H43813 Vitreous degeneration, bilateral: Secondary | ICD-10-CM | POA: Diagnosis not present

## 2022-11-14 ENCOUNTER — Other Ambulatory Visit: Payer: Self-pay | Admitting: Internal Medicine

## 2022-11-14 DIAGNOSIS — Z1231 Encounter for screening mammogram for malignant neoplasm of breast: Secondary | ICD-10-CM

## 2022-12-13 DIAGNOSIS — H353221 Exudative age-related macular degeneration, left eye, with active choroidal neovascularization: Secondary | ICD-10-CM | POA: Diagnosis not present

## 2022-12-30 ENCOUNTER — Ambulatory Visit
Admission: RE | Admit: 2022-12-30 | Discharge: 2022-12-30 | Disposition: A | Discharge status: Home / Self Care | Payer: PPO | Source: Ambulatory Visit | Attending: Internal Medicine | Admitting: Internal Medicine

## 2022-12-30 DIAGNOSIS — Z1231 Encounter for screening mammogram for malignant neoplasm of breast: Secondary | ICD-10-CM | POA: Diagnosis not present

## 2023-01-19 DIAGNOSIS — Z79899 Other long term (current) drug therapy: Secondary | ICD-10-CM | POA: Diagnosis not present

## 2023-01-19 DIAGNOSIS — R7309 Other abnormal glucose: Secondary | ICD-10-CM | POA: Diagnosis not present

## 2023-01-19 DIAGNOSIS — E782 Mixed hyperlipidemia: Secondary | ICD-10-CM | POA: Diagnosis not present

## 2023-01-20 DIAGNOSIS — H43813 Vitreous degeneration, bilateral: Secondary | ICD-10-CM | POA: Diagnosis not present

## 2023-01-20 DIAGNOSIS — H4321 Crystalline deposits in vitreous body, right eye: Secondary | ICD-10-CM | POA: Diagnosis not present

## 2023-01-20 DIAGNOSIS — H353231 Exudative age-related macular degeneration, bilateral, with active choroidal neovascularization: Secondary | ICD-10-CM | POA: Diagnosis not present

## 2023-01-20 DIAGNOSIS — H26492 Other secondary cataract, left eye: Secondary | ICD-10-CM | POA: Diagnosis not present

## 2023-01-26 DIAGNOSIS — Z79899 Other long term (current) drug therapy: Secondary | ICD-10-CM | POA: Diagnosis not present

## 2023-01-26 DIAGNOSIS — E559 Vitamin D deficiency, unspecified: Secondary | ICD-10-CM | POA: Diagnosis not present

## 2023-01-26 DIAGNOSIS — Z0000 Encounter for general adult medical examination without abnormal findings: Secondary | ICD-10-CM | POA: Diagnosis not present

## 2023-01-26 DIAGNOSIS — I10 Essential (primary) hypertension: Secondary | ICD-10-CM | POA: Diagnosis not present

## 2023-01-26 DIAGNOSIS — R7309 Other abnormal glucose: Secondary | ICD-10-CM | POA: Diagnosis not present

## 2023-01-26 DIAGNOSIS — E782 Mixed hyperlipidemia: Secondary | ICD-10-CM | POA: Diagnosis not present

## 2023-02-24 DIAGNOSIS — H353221 Exudative age-related macular degeneration, left eye, with active choroidal neovascularization: Secondary | ICD-10-CM | POA: Diagnosis not present

## 2023-03-31 DIAGNOSIS — H353231 Exudative age-related macular degeneration, bilateral, with active choroidal neovascularization: Secondary | ICD-10-CM | POA: Diagnosis not present

## 2023-03-31 DIAGNOSIS — H43813 Vitreous degeneration, bilateral: Secondary | ICD-10-CM | POA: Diagnosis not present

## 2023-03-31 DIAGNOSIS — H26492 Other secondary cataract, left eye: Secondary | ICD-10-CM | POA: Diagnosis not present

## 2023-03-31 DIAGNOSIS — H4321 Crystalline deposits in vitreous body, right eye: Secondary | ICD-10-CM | POA: Diagnosis not present

## 2023-05-03 DIAGNOSIS — H353221 Exudative age-related macular degeneration, left eye, with active choroidal neovascularization: Secondary | ICD-10-CM | POA: Diagnosis not present

## 2023-06-09 DIAGNOSIS — H26492 Other secondary cataract, left eye: Secondary | ICD-10-CM | POA: Diagnosis not present

## 2023-06-09 DIAGNOSIS — H4321 Crystalline deposits in vitreous body, right eye: Secondary | ICD-10-CM | POA: Diagnosis not present

## 2023-06-09 DIAGNOSIS — H43813 Vitreous degeneration, bilateral: Secondary | ICD-10-CM | POA: Diagnosis not present

## 2023-06-09 DIAGNOSIS — H353231 Exudative age-related macular degeneration, bilateral, with active choroidal neovascularization: Secondary | ICD-10-CM | POA: Diagnosis not present

## 2023-07-14 DIAGNOSIS — H353231 Exudative age-related macular degeneration, bilateral, with active choroidal neovascularization: Secondary | ICD-10-CM | POA: Diagnosis not present

## 2023-07-20 DIAGNOSIS — E782 Mixed hyperlipidemia: Secondary | ICD-10-CM | POA: Diagnosis not present

## 2023-07-20 DIAGNOSIS — Z79899 Other long term (current) drug therapy: Secondary | ICD-10-CM | POA: Diagnosis not present

## 2023-07-20 DIAGNOSIS — R7309 Other abnormal glucose: Secondary | ICD-10-CM | POA: Diagnosis not present

## 2023-07-20 DIAGNOSIS — I10 Essential (primary) hypertension: Secondary | ICD-10-CM | POA: Diagnosis not present

## 2023-07-27 DIAGNOSIS — Z23 Encounter for immunization: Secondary | ICD-10-CM | POA: Diagnosis not present

## 2023-07-27 DIAGNOSIS — R739 Hyperglycemia, unspecified: Secondary | ICD-10-CM | POA: Diagnosis not present

## 2023-07-27 DIAGNOSIS — Z79899 Other long term (current) drug therapy: Secondary | ICD-10-CM | POA: Diagnosis not present

## 2023-07-27 DIAGNOSIS — E559 Vitamin D deficiency, unspecified: Secondary | ICD-10-CM | POA: Diagnosis not present

## 2023-07-27 DIAGNOSIS — E7800 Pure hypercholesterolemia, unspecified: Secondary | ICD-10-CM | POA: Diagnosis not present

## 2023-07-27 DIAGNOSIS — Z1231 Encounter for screening mammogram for malignant neoplasm of breast: Secondary | ICD-10-CM | POA: Diagnosis not present

## 2023-07-27 DIAGNOSIS — I10 Essential (primary) hypertension: Secondary | ICD-10-CM | POA: Diagnosis not present

## 2023-07-28 ENCOUNTER — Other Ambulatory Visit: Payer: Self-pay | Admitting: Internal Medicine

## 2023-07-28 DIAGNOSIS — Z1231 Encounter for screening mammogram for malignant neoplasm of breast: Secondary | ICD-10-CM

## 2023-08-18 DIAGNOSIS — H4321 Crystalline deposits in vitreous body, right eye: Secondary | ICD-10-CM | POA: Diagnosis not present

## 2023-08-18 DIAGNOSIS — H43813 Vitreous degeneration, bilateral: Secondary | ICD-10-CM | POA: Diagnosis not present

## 2023-08-18 DIAGNOSIS — H353231 Exudative age-related macular degeneration, bilateral, with active choroidal neovascularization: Secondary | ICD-10-CM | POA: Diagnosis not present

## 2023-08-18 DIAGNOSIS — H26492 Other secondary cataract, left eye: Secondary | ICD-10-CM | POA: Diagnosis not present

## 2023-09-15 DIAGNOSIS — H353221 Exudative age-related macular degeneration, left eye, with active choroidal neovascularization: Secondary | ICD-10-CM | POA: Diagnosis not present

## 2023-10-27 DIAGNOSIS — H43813 Vitreous degeneration, bilateral: Secondary | ICD-10-CM | POA: Diagnosis not present

## 2023-10-27 DIAGNOSIS — H26492 Other secondary cataract, left eye: Secondary | ICD-10-CM | POA: Diagnosis not present

## 2023-10-27 DIAGNOSIS — H4321 Crystalline deposits in vitreous body, right eye: Secondary | ICD-10-CM | POA: Diagnosis not present

## 2023-10-27 DIAGNOSIS — H353231 Exudative age-related macular degeneration, bilateral, with active choroidal neovascularization: Secondary | ICD-10-CM | POA: Diagnosis not present

## 2023-12-08 DIAGNOSIS — H353221 Exudative age-related macular degeneration, left eye, with active choroidal neovascularization: Secondary | ICD-10-CM | POA: Diagnosis not present

## 2024-01-01 ENCOUNTER — Ambulatory Visit
Admission: RE | Admit: 2024-01-01 | Discharge: 2024-01-01 | Disposition: A | Discharge status: Home / Self Care | Payer: PPO | Source: Ambulatory Visit | Attending: Internal Medicine | Admitting: Internal Medicine

## 2024-01-01 DIAGNOSIS — Z1231 Encounter for screening mammogram for malignant neoplasm of breast: Secondary | ICD-10-CM | POA: Insufficient documentation

## 2024-01-19 DIAGNOSIS — H353231 Exudative age-related macular degeneration, bilateral, with active choroidal neovascularization: Secondary | ICD-10-CM | POA: Diagnosis not present

## 2024-01-19 DIAGNOSIS — H4321 Crystalline deposits in vitreous body, right eye: Secondary | ICD-10-CM | POA: Diagnosis not present

## 2024-01-19 DIAGNOSIS — H26492 Other secondary cataract, left eye: Secondary | ICD-10-CM | POA: Diagnosis not present

## 2024-01-19 DIAGNOSIS — H43813 Vitreous degeneration, bilateral: Secondary | ICD-10-CM | POA: Diagnosis not present

## 2024-02-19 DIAGNOSIS — Z79899 Other long term (current) drug therapy: Secondary | ICD-10-CM | POA: Diagnosis not present

## 2024-02-19 DIAGNOSIS — E559 Vitamin D deficiency, unspecified: Secondary | ICD-10-CM | POA: Diagnosis not present

## 2024-02-19 DIAGNOSIS — R739 Hyperglycemia, unspecified: Secondary | ICD-10-CM | POA: Diagnosis not present

## 2024-02-19 DIAGNOSIS — I10 Essential (primary) hypertension: Secondary | ICD-10-CM | POA: Diagnosis not present

## 2024-02-19 DIAGNOSIS — E7800 Pure hypercholesterolemia, unspecified: Secondary | ICD-10-CM | POA: Diagnosis not present

## 2024-02-21 DIAGNOSIS — Z79899 Other long term (current) drug therapy: Secondary | ICD-10-CM | POA: Diagnosis not present

## 2024-02-21 DIAGNOSIS — R739 Hyperglycemia, unspecified: Secondary | ICD-10-CM | POA: Diagnosis not present

## 2024-02-21 DIAGNOSIS — E559 Vitamin D deficiency, unspecified: Secondary | ICD-10-CM | POA: Diagnosis not present

## 2024-02-21 DIAGNOSIS — I10 Essential (primary) hypertension: Secondary | ICD-10-CM | POA: Diagnosis not present

## 2024-02-21 DIAGNOSIS — Z0000 Encounter for general adult medical examination without abnormal findings: Secondary | ICD-10-CM | POA: Diagnosis not present

## 2024-02-21 DIAGNOSIS — E782 Mixed hyperlipidemia: Secondary | ICD-10-CM | POA: Diagnosis not present

## 2024-03-01 DIAGNOSIS — H353221 Exudative age-related macular degeneration, left eye, with active choroidal neovascularization: Secondary | ICD-10-CM | POA: Diagnosis not present

## 2024-04-15 DIAGNOSIS — H43813 Vitreous degeneration, bilateral: Secondary | ICD-10-CM | POA: Diagnosis not present

## 2024-04-15 DIAGNOSIS — H353231 Exudative age-related macular degeneration, bilateral, with active choroidal neovascularization: Secondary | ICD-10-CM | POA: Diagnosis not present

## 2024-04-15 DIAGNOSIS — H26493 Other secondary cataract, bilateral: Secondary | ICD-10-CM | POA: Diagnosis not present

## 2024-04-15 DIAGNOSIS — H4321 Crystalline deposits in vitreous body, right eye: Secondary | ICD-10-CM | POA: Diagnosis not present

## 2024-05-17 DIAGNOSIS — H353221 Exudative age-related macular degeneration, left eye, with active choroidal neovascularization: Secondary | ICD-10-CM | POA: Diagnosis not present

## 2024-07-04 DIAGNOSIS — H353231 Exudative age-related macular degeneration, bilateral, with active choroidal neovascularization: Secondary | ICD-10-CM | POA: Diagnosis not present

## 2024-07-04 DIAGNOSIS — H43813 Vitreous degeneration, bilateral: Secondary | ICD-10-CM | POA: Diagnosis not present

## 2024-07-04 DIAGNOSIS — H4321 Crystalline deposits in vitreous body, right eye: Secondary | ICD-10-CM | POA: Diagnosis not present

## 2024-07-04 DIAGNOSIS — H26493 Other secondary cataract, bilateral: Secondary | ICD-10-CM | POA: Diagnosis not present

## 2024-07-26 ENCOUNTER — Ambulatory Visit: Admission: EM | Admit: 2024-07-26 | Discharge: 2024-07-26 | Disposition: A | Discharge status: Home / Self Care

## 2024-07-26 DIAGNOSIS — U071 COVID-19: Secondary | ICD-10-CM

## 2024-07-26 LAB — POC SOFIA SARS ANTIGEN FIA: SARS Coronavirus 2 Ag: POSITIVE — AB

## 2024-07-26 MED ORDER — PAXLOVID (300/100) 20 X 150 MG & 10 X 100MG PO TBPK
3.0000 | ORAL_TABLET | Freq: Two times a day (BID) | ORAL | 0 refills | Status: AC
Start: 2024-07-26 — End: 2024-07-31

## 2024-07-26 NOTE — Discharge Instructions (Addendum)
 Take the Paxlovid  as directed.  Take Tylenol as needed for fever or discomfort.  Rest and keep yourself hydrated.    Stop your atorvastatin while taking Paxlovid  and for 5 additional days after the Paxlovid  is finished.   Follow up with your primary care provider tomorrow.  Go to the emergency department if you have worsening symptoms.

## 2024-07-26 NOTE — ED Triage Notes (Signed)
 Patient to Urgent Care with complaints of productive cough/ nasal and chest congestion. Denies any known fevers.   Symptoms x2-3 days. Husband Covid positive.   Meds: cough suppressant/ tylenol.

## 2024-07-26 NOTE — ED Provider Notes (Signed)
 Tiffany Skinner    CSN: [id number] Arrival date & time: 07/26/24  9147      History   Chief Complaint Chief Complaint  Patient presents with   Cough   Nasal Congestion    HPI Tiffany Skinner is a 83 y.o. female.  Patient presents with 3-day history of congestion and cough.  No fever or shortness of breath.  Treatment attempted with Tylenol and cough medicine.  Her husband had COVID last week.  Patient had the COVID and flu shots on 07/20/2024.  The history is provided by the patient and medical records.    Past Medical History:  Diagnosis Date   Allergic state    Anemia    Arthritis    Breast cancer (HCC) 2010   Left   History of colonic diverticulitis    Hx of colonic polyp    Hyperlipidemia    Hypertension    Personal history of radiation therapy 2010   mammosite    Patient Active Problem List   Diagnosis Date Noted   Carcinoma of overlapping sites of left breast in female, estrogen receptor positive (HCC) 08/22/2016   Cancer (HCC) 08/15/2016    Past Surgical History:  Procedure Laterality Date   BREAST BIOPSY Left 2010   stage 1 carcinoma   BREAST LUMPECTOMY Left 2010   stage 1  carcinoma with SN bx   COLONOSCOPY  04/24/2012   COLONOSCOPY WITH PROPOFOL  N/A 08/21/2017   Procedure: COLONOSCOPY WITH PROPOFOL ;  Surgeon: Viktoria Lamar DASEN, MD;  Location: [facility] ENDOSCOPY;  Service: Endoscopy;  Laterality: N/A;   TUBAL LIGATION      OB History   No obstetric history on file.      Home Medications    Prior to Admission medications   Medication Sig Start Date End Date Taking? Authorizing Provider  atorvastatin (LIPITOR) 20 MG tablet Take 1 tablet by mouth once daily 06/22/15  Yes [provider]  cholecalciferol (VITAMIN D) 400 units TABS tablet Take 1,000 Units by mouth daily.   Yes [provider]  imipramine (TOFRANIL) 10 MG tablet Take 10 mg by mouth. 05/25/24  Yes [provider]  losartan-hydrochlorothiazide (HYZAAR)  100-12.5 MG per tablet Take 1 tablet by mouth once daily 06/22/15  Yes [provider]  Multiple Vitamins-Minerals (PRESERVISION AREDS 2 PO) Take by mouth.   Yes [provider]  nirmatrelvir/ritonavir (PAXLOVID , 300/100,) 20 x 150 MG & 10 x 100MG  TBPK Take 3 tablets by mouth 2 (two) times daily for 5 days. 07/26/24 07/31/24 Yes Corlis Burnard DEL, NP  aspirin EC 81 MG tablet Take by mouth. Patient not taking: Reported on 07/26/2024    [provider]  calcium carbonate (OS-CAL - DOSED IN MG OF ELEMENTAL CALCIUM) 1250 (500 CA) MG tablet Take by mouth.    [provider]  cyanocobalamin 1000 MCG tablet Take 1,000 mcg by mouth daily. Patient not taking: Reported on 07/26/2024    [provider]  Multiple Vitamin (MULTIVITAMIN) capsule Take by mouth.    [provider]  PNEUMOVAX 23 25 MCG/0.5ML injection  10/06/20   [provider]  polyethylene glycol powder (GLYCOLAX/MIRALAX) powder Take 1 Container by mouth once. Patient not taking: Reported on 07/26/2024    [provider]  tobramycin (TOBREX) 0.3 % ophthalmic solution PLEASE SEE ATTACHED FOR DETAILED DIRECTIONS Patient not taking: Reported on 07/26/2024 12/26/20   [provider]    Family History Family History  Problem Relation Age of Onset   Stroke Mother  Stroke Father    Breast cancer Neg Hx     Social History Social History   Tobacco Use   Smoking status: Never   Smokeless tobacco: Never  Substance Use Topics   Alcohol use: Yes    Alcohol/week: 0.0 standard drinks of alcohol   Drug use: No     Allergies   Patient has no known allergies.   Review of Systems Review of Systems  Constitutional:  Negative for chills and fever.  HENT:  Positive for congestion and rhinorrhea. Negative for ear pain and sore throat.   Respiratory:  Positive for cough. Negative for shortness of breath.      Physical Exam Triage Vital Signs ED Triage Vitals   Encounter Vitals Group     BP 07/26/24 0926 134/83     Girls Systolic BP Percentile --      Girls Diastolic BP Percentile --      Boys Systolic BP Percentile --      Boys Diastolic BP Percentile --      Pulse Rate 07/26/24 0926 84     Resp 07/26/24 0926 20     Temp 07/26/24 0926 97.7 F (36.5 C)     Temp src --      SpO2 07/26/24 0926 98 %     Weight --      Height --      Head Circumference --      Peak Flow --      Pain Score 07/26/24 0920 0     Pain Loc --      Pain Education --      Exclude from Growth Chart --    No data found.  Updated Vital Signs BP 134/83   Pulse 84   Temp 97.7 F (36.5 C)   Resp 20   SpO2 98%   Visual Acuity Right Eye Distance:   Left Eye Distance:   Bilateral Distance:    Right Eye Near:   Left Eye Near:    Bilateral Near:     Physical Exam Constitutional:      General: She is not in acute distress. HENT:     Right Ear: Tympanic membrane normal.     Left Ear: Tympanic membrane normal.     Nose: Rhinorrhea present.     Mouth/Throat:     Mouth: Mucous membranes are moist.     Pharynx: Oropharynx is clear.  Cardiovascular:     Rate and Rhythm: Normal rate and regular rhythm.     Heart sounds: Normal heart sounds.  Pulmonary:     Effort: Pulmonary effort is normal. No respiratory distress.     Breath sounds: Normal breath sounds.  Neurological:     Mental Status: She is alert.      UC Treatments / Results  Labs (all labs ordered are listed, but only abnormal results are displayed) Labs Reviewed  POC SOFIA SARS ANTIGEN FIA - Abnormal; Notable for the following components:      Result Value   SARS Coronavirus 2 Ag Positive (*)    All other components within normal limits    EKG   Radiology No results found.  Procedures Procedures (including critical care time)  Medications Ordered in UC Medications - No data to display  Initial Impression / Assessment and Plan / UC Course  I have reviewed the triage vital  signs and the nursing notes.  Pertinent labs & imaging results that were available during my care of the patient were reviewed  by me and considered in my medical decision making (see chart for details).    COVID-19.  Afebrile and vital signs are stable.  Lungs are clear and O2 sat is 98% on room air.  Treating with Paxlovid .  GFR 86 on 02/19/2024.  Discussed that this medication is emergency authorized for treatment of COVID.  Discussed the side effects of Paxlovid , including dysgeusia, diarrhea, myalgias, hypertension.  Also discussed the possibility of rebound COVID.  Instructed her to pause her statin while on Paxlovid  and then for 5 additional days.  Instructed patient to notify her PCP that she is COVID positive and taking Paxlovid .  ED precautions given.  Patient agrees to plan of care.    Final Clinical Impressions(s) / UC Diagnoses   Final diagnoses:  COVID-19     Discharge Instructions      Take the Paxlovid  as directed.  Take Tylenol as needed for fever or discomfort.  Rest and keep yourself hydrated.    Stop your atorvastatin while taking Paxlovid  and for 5 additional days after the Paxlovid  is finished.   Follow up with your primary care provider tomorrow.  Go to the emergency department if you have worsening symptoms.        ED Prescriptions     Medication Sig Dispense Auth. Provider   nirmatrelvir/ritonavir (PAXLOVID , 300/100,) 20 x 150 MG & 10 x 100MG  TBPK Take 3 tablets by mouth 2 (two) times daily for 5 days. 30 tablet Corlis Burnard DEL, NP      PDMP not reviewed this encounter.   Corlis Burnard DEL, NP 07/26/24 1007

## 2024-08-14 DIAGNOSIS — R739 Hyperglycemia, unspecified: Secondary | ICD-10-CM | POA: Diagnosis not present

## 2024-08-14 DIAGNOSIS — E782 Mixed hyperlipidemia: Secondary | ICD-10-CM | POA: Diagnosis not present

## 2024-08-14 DIAGNOSIS — Z79899 Other long term (current) drug therapy: Secondary | ICD-10-CM | POA: Diagnosis not present

## 2024-08-14 DIAGNOSIS — E559 Vitamin D deficiency, unspecified: Secondary | ICD-10-CM | POA: Diagnosis not present

## 2024-08-14 DIAGNOSIS — I10 Essential (primary) hypertension: Secondary | ICD-10-CM | POA: Diagnosis not present

## 2024-08-21 DIAGNOSIS — R739 Hyperglycemia, unspecified: Secondary | ICD-10-CM | POA: Diagnosis not present

## 2024-08-21 DIAGNOSIS — Z79899 Other long term (current) drug therapy: Secondary | ICD-10-CM | POA: Diagnosis not present

## 2024-08-21 DIAGNOSIS — I10 Essential (primary) hypertension: Secondary | ICD-10-CM | POA: Diagnosis not present

## 2024-08-21 DIAGNOSIS — E7800 Pure hypercholesterolemia, unspecified: Secondary | ICD-10-CM | POA: Diagnosis not present

## 2024-08-21 DIAGNOSIS — E559 Vitamin D deficiency, unspecified: Secondary | ICD-10-CM | POA: Diagnosis not present

## 2024-08-27 DIAGNOSIS — H353231 Exudative age-related macular degeneration, bilateral, with active choroidal neovascularization: Secondary | ICD-10-CM | POA: Diagnosis not present
